# Patient Record
Sex: Female | Born: 1995 | Race: Black or African American | Hispanic: No | Marital: Single | Smoking: Never smoker
Health system: Southern US, Community
[De-identification: ages and names within clinical notes are randomized; demographics above are authoritative.]

## PROBLEM LIST (undated history)

## (undated) DIAGNOSIS — Z789 Other specified health status: Secondary | ICD-10-CM

## (undated) DIAGNOSIS — I10 Essential (primary) hypertension: Secondary | ICD-10-CM

## (undated) HISTORY — PX: NO PAST SURGERIES: SHX2092

## (undated) HISTORY — DX: Essential (primary) hypertension: I10

## (undated) HISTORY — DX: Other specified health status: Z78.9

---

## 2017-10-05 ENCOUNTER — Emergency Department (HOSPITAL_COMMUNITY)
Admission: EM | Admit: 2017-10-05 | Discharge: 2017-10-05 | Disposition: A | Discharge status: Home / Self Care | Payer: BC Managed Care – PPO | Attending: Emergency Medicine | Admitting: Emergency Medicine

## 2017-10-05 ENCOUNTER — Encounter (HOSPITAL_COMMUNITY): Payer: Self-pay | Admitting: Emergency Medicine

## 2017-10-05 ENCOUNTER — Emergency Department (HOSPITAL_COMMUNITY): Payer: BC Managed Care – PPO

## 2017-10-05 DIAGNOSIS — S99921A Unspecified injury of right foot, initial encounter: Secondary | ICD-10-CM | POA: Diagnosis present

## 2017-10-05 DIAGNOSIS — Y998 Other external cause status: Secondary | ICD-10-CM | POA: Diagnosis not present

## 2017-10-05 DIAGNOSIS — S93601A Unspecified sprain of right foot, initial encounter: Secondary | ICD-10-CM

## 2017-10-05 DIAGNOSIS — S9031XA Contusion of right foot, initial encounter: Secondary | ICD-10-CM

## 2017-10-05 DIAGNOSIS — Y92410 Unspecified street and highway as the place of occurrence of the external cause: Secondary | ICD-10-CM | POA: Insufficient documentation

## 2017-10-05 DIAGNOSIS — Y939 Activity, unspecified: Secondary | ICD-10-CM | POA: Diagnosis not present

## 2017-10-05 LAB — I-STAT BETA HCG BLOOD, ED (MC, WL, AP ONLY)

## 2017-10-05 MED ORDER — KETOROLAC TROMETHAMINE 60 MG/2ML IM SOLN
60.0000 mg | Freq: Once | INTRAMUSCULAR | Status: AC
  Administered 2017-10-05: 60 mg via INTRAMUSCULAR
  Filled 2017-10-05: qty 2

## 2017-10-05 MED ORDER — HYDROCODONE-ACETAMINOPHEN 5-325 MG PO TABS: 1.0000 | ORAL_TABLET | Freq: Four times a day (QID) | ORAL | 0 refills | Status: DC | PRN
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
## 2017-10-05 NOTE — Discharge Instructions (Signed)
You can take Tylenol or Ibuprofen as directed for pain. You can alternate Tylenol and Ibuprofen every 4 hours. If you take Tylenol at 1pm, then you can take Ibuprofen at 5pm. Then you can take Tylenol again at 9pm. You can take the pain medication for severe or breakthrough pain.  Do not take any more ibuprofen tonight as the medication we gave you contains ibuprofen.  Use the crutches for the next 4-5 days. After that, attempt per weight on the foot and start increasing as tolerated.  Follow the RICE (Rest, Ice, Compression, Elevation) protocol as directed.   Return the emergency Department immediately for any worsening pain, swelling of the foot, redness of the foot, discoloration of the foot, numbness or weakness of the foot or any other worsening or concerning symptoms.

## 2019-06-26 IMAGING — CR DG FOOT COMPLETE 3+V*R*
3 series · 3 of 3 positions shown · non-contrast
Comparison: None.

CLINICAL DATA: 20-year-old female with a history of injury to the
right foot pain at right great toe

EXAM:
RIGHT FOOT COMPLETE - 3+ VIEW

[x foot ap right]
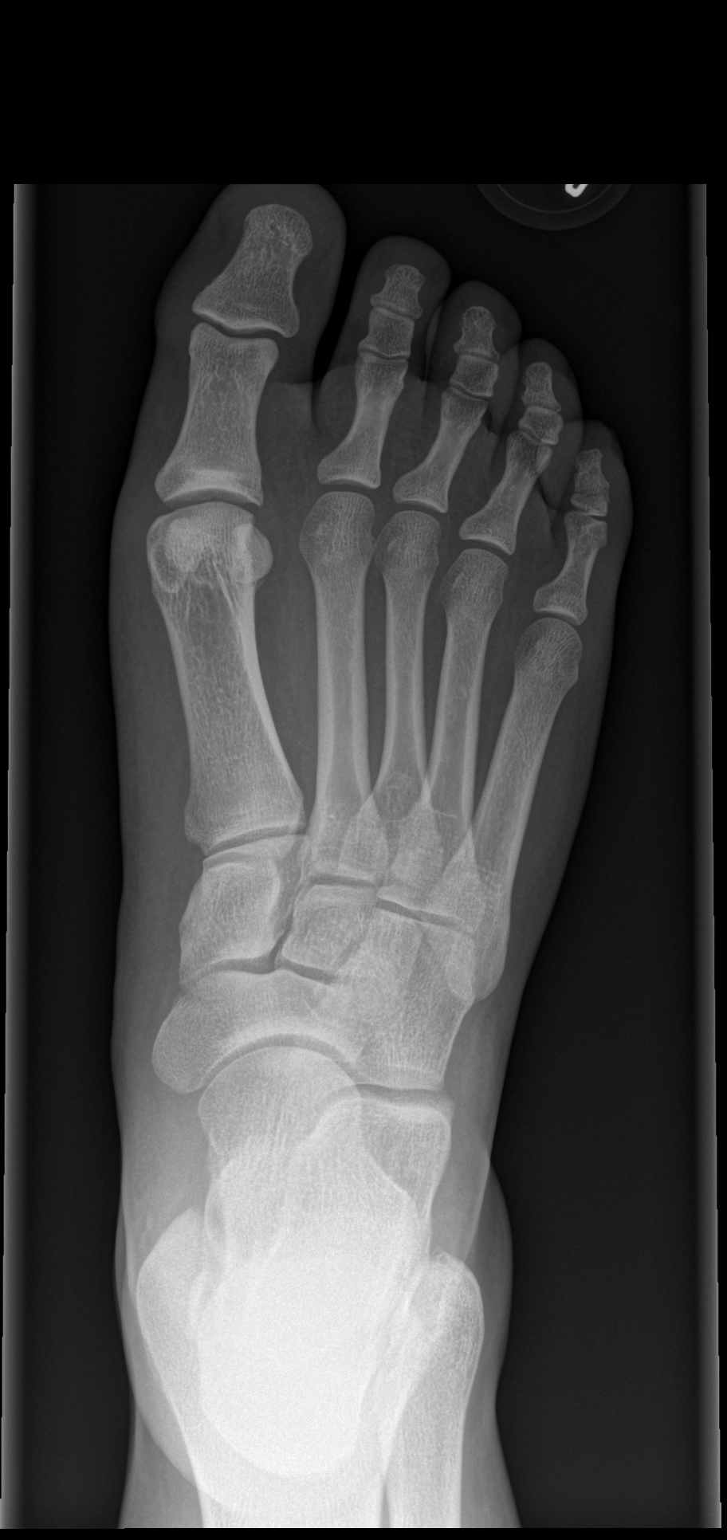

[x foot obl right]
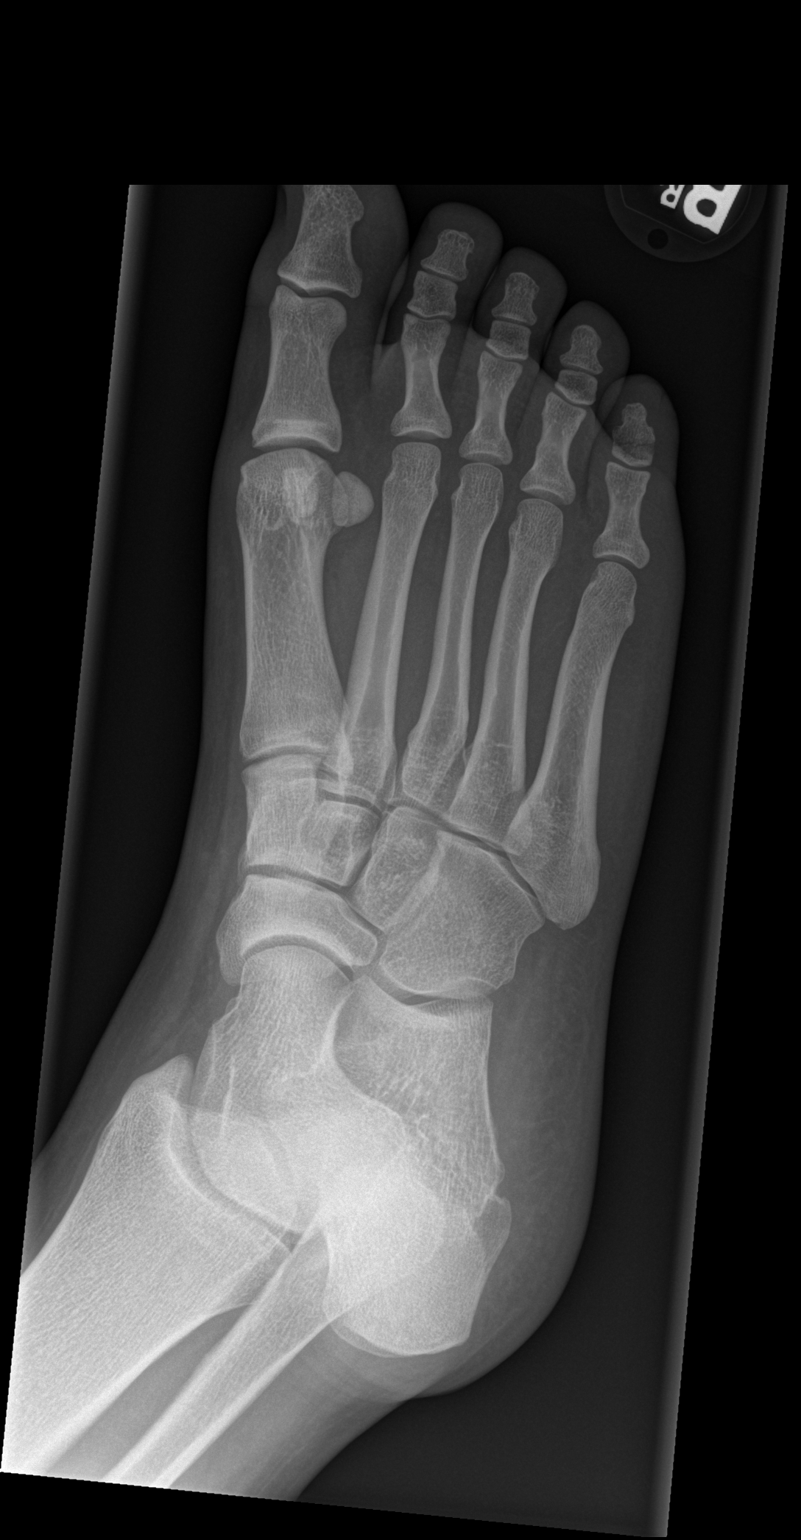

[x foot lat right]
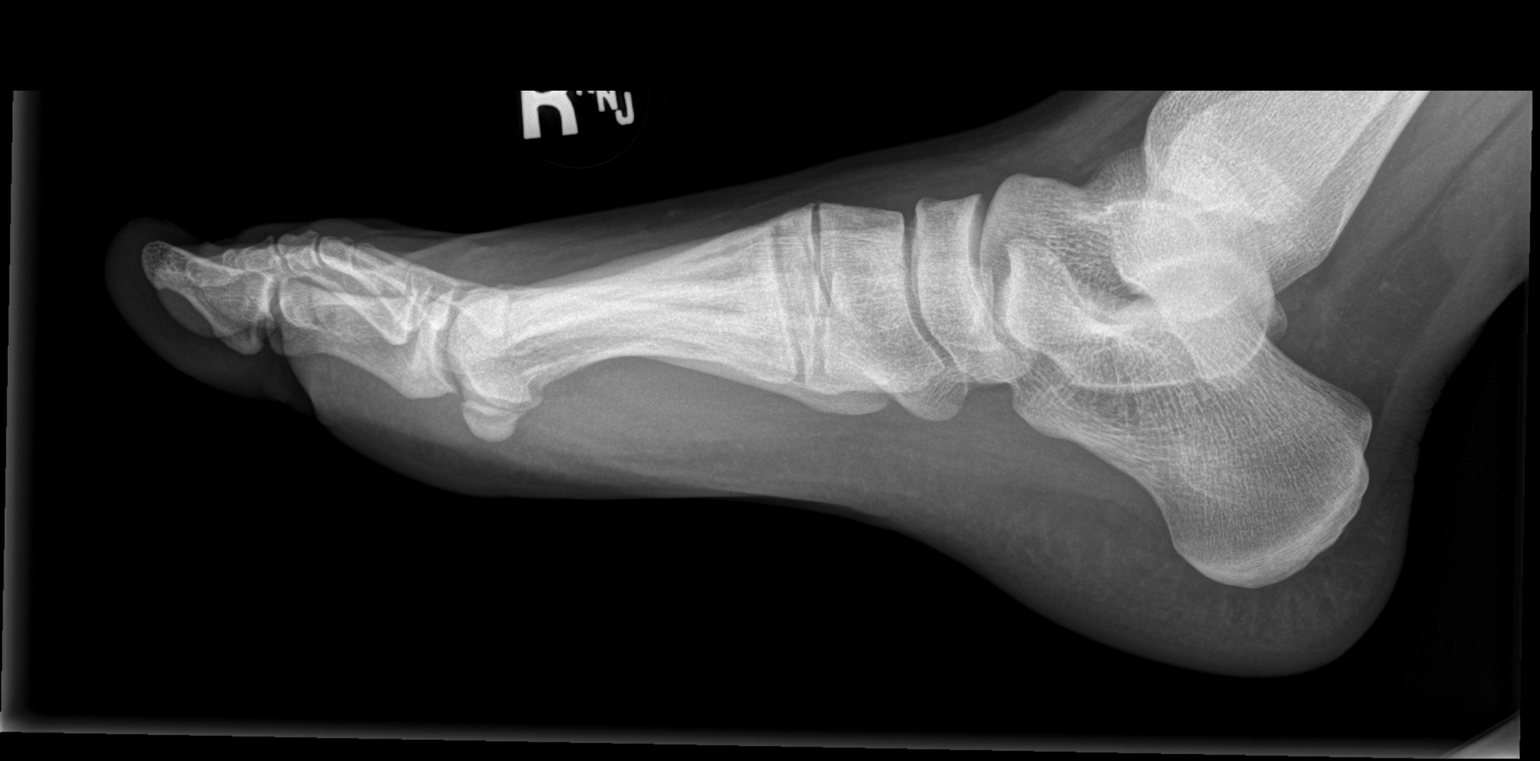

[3 of 3 positions shown; findings below may reference images not displayed]

FINDINGS: No acute displaced fracture identified. Lateral view demonstrates
questionable soft tissue swelling on the dorsum of the forefoot. No
radiopaque foreign body. Joints are congruent.
IMPRESSION: No acute fracture identified.

Questionable soft tissue swelling on the dorsum of the forefoot

## 2020-02-26 ENCOUNTER — Ambulatory Visit (INDEPENDENT_AMBULATORY_CARE_PROVIDER_SITE_OTHER): Payer: BC Managed Care – PPO | Admitting: Adult Health

## 2020-02-26 ENCOUNTER — Encounter: Payer: Self-pay | Admitting: Adult Health

## 2020-02-26 ENCOUNTER — Other Ambulatory Visit: Payer: Self-pay

## 2020-02-26 VITALS — BP 140/98 | HR 68 | Ht 63.0 in | Wt 200.0 lb

## 2020-02-26 DIAGNOSIS — F331 Major depressive disorder, recurrent, moderate: Secondary | ICD-10-CM | POA: Diagnosis not present

## 2020-02-26 DIAGNOSIS — F411 Generalized anxiety disorder: Secondary | ICD-10-CM

## 2020-02-26 DIAGNOSIS — G4700 Insomnia, unspecified: Secondary | ICD-10-CM | POA: Diagnosis not present

## 2020-02-26 DIAGNOSIS — F4310 Post-traumatic stress disorder, unspecified: Secondary | ICD-10-CM | POA: Diagnosis not present

## 2020-02-26 MED ORDER — HYDROXYZINE HCL 10 MG PO TABS: ORAL_TABLET | ORAL | 2 refills | Status: DC

## 2020-02-26 MED ORDER — FLUOXETINE HCL 10 MG PO CAPS: ORAL_CAPSULE | ORAL | 2 refills | Status: DC
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
## 2020-03-25 ENCOUNTER — Ambulatory Visit: Payer: BC Managed Care – PPO | Admitting: Adult Health

## 2021-04-06 ENCOUNTER — Institutional Professional Consult (permissible substitution): Payer: Self-pay | Admitting: Plastic Surgery

## 2021-07-09 ENCOUNTER — Institutional Professional Consult (permissible substitution): Payer: Self-pay | Admitting: Plastic Surgery

## 2023-05-31 ENCOUNTER — Encounter: Payer: Medicaid Other | Admitting: Obstetrics and Gynecology

## 2023-08-09 ENCOUNTER — Ambulatory Visit (INDEPENDENT_AMBULATORY_CARE_PROVIDER_SITE_OTHER): Payer: Medicaid Other

## 2023-08-09 DIAGNOSIS — Z3201 Encounter for pregnancy test, result positive: Secondary | ICD-10-CM | POA: Diagnosis not present

## 2023-08-09 DIAGNOSIS — N912 Amenorrhea, unspecified: Secondary | ICD-10-CM | POA: Diagnosis not present

## 2023-08-09 DIAGNOSIS — Z3200 Encounter for pregnancy test, result unknown: Secondary | ICD-10-CM

## 2023-08-09 LAB — POCT URINE PREGNANCY: Preg Test, Ur: POSITIVE — AB

## 2023-08-09 MED ORDER — PRENATE MINI 18-0.6-0.4-350 MG PO CAPS: 1.0000 | ORAL_CAPSULE | Freq: Every day | ORAL | 11 refills | Status: DC

## 2023-08-09 NOTE — Progress Notes (Signed)
..  Ms. Callins presents today for UPT. She has no unusual complaints. LMP:06/25/23    OBJECTIVE: Appears well, in no apparent distress.  OB History     Gravida  1   Para      Term      Preterm      AB      Living         SAB      IAB      Ectopic      Multiple      Live Births             Home UPT Result:Positive In-Office UPT result:Positive  I have reviewed the patient's medical, obstetrical, social, and family histories, and medications.   ASSESSMENT: Positive pregnancy test  PLAN Prenatal care to be completed at: [facility] Provided safe med list PNV sent to pharmacy

## 2023-08-31 ENCOUNTER — Ambulatory Visit: Payer: Medicaid Other | Admitting: *Deleted

## 2023-08-31 ENCOUNTER — Other Ambulatory Visit (INDEPENDENT_AMBULATORY_CARE_PROVIDER_SITE_OTHER): Payer: Medicaid Other

## 2023-08-31 VITALS — BP 139/82 | HR 77 | Wt 215.7 lb

## 2023-08-31 DIAGNOSIS — O3680X0 Pregnancy with inconclusive fetal viability, not applicable or unspecified: Secondary | ICD-10-CM

## 2023-08-31 DIAGNOSIS — Z3480 Encounter for supervision of other normal pregnancy, unspecified trimester: Secondary | ICD-10-CM | POA: Insufficient documentation

## 2023-08-31 DIAGNOSIS — Z3401 Encounter for supervision of normal first pregnancy, first trimester: Secondary | ICD-10-CM

## 2023-08-31 DIAGNOSIS — Z3A09 9 weeks gestation of pregnancy: Secondary | ICD-10-CM

## 2023-08-31 MED ORDER — PROMETHAZINE HCL 25 MG PO TABS: 25.0000 mg | ORAL_TABLET | Freq: Four times a day (QID) | ORAL | 1 refills | Status: DC | PRN

## 2023-08-31 MED ORDER — DOXYLAMINE-PYRIDOXINE 10-10 MG PO TBEC: 2.0000 | DELAYED_RELEASE_TABLET | Freq: Every day | ORAL | 5 refills | Status: DC

## 2023-08-31 MED ORDER — BLOOD PRESSURE KIT DEVI: 1.0000 | 0 refills | Status: AC

## 2023-08-31 NOTE — Patient Instructions (Signed)
[facility] has a partnership with the [employer] to provide prenatal navigation for the most needed resources in our community. In order to see how we can help connect you to these resources we need consent to contact you. Please complete the very short consent using the link below:   English Link: https://[place].tfaforms.net/283?site=16  Spanish Link: https://[place].tfaforms.net/287?site=16  Options for Doula Care in the [place] Area  As you review your birthing options, consider having a birth doula. A doula is trained to provide support before, during and just after you give birth. There are also postpartum doulas that help you adjust to new parenthood.  While doulas do not provide medical care, they do provide emotional, physical and educational support. A few months before your baby arrives, doulas can help answer questions, ease concerns and help you create and support your birthing plan.    Doulas can help reduce your stress and comfort you and your partner. They can help you cope with labor by helping you use breathing techniques, massage, creative labor positioning, essential oils and affirmations.   Studies show that the benefits of having a doula include:   A more positive birth experience  Fewer requests for pain-relief medication  Less likelihood of cesarean section, commonly called a c-section   Doulas are typically hired via a Ad[employer] account planner between you and the doula. We are happy to provide a list of the most active doulas in the area, all of whom are credentialed by [facility] and will not count as a visitor at your birth.  There are several options for no-cost doula care at our hospital, including:  [facility] Volunteer Doula Program Every [employer] Program A Cure 4 Moms Doula Study (available only at [employer] [facility], [place], [place] and [facility] offices)  For more information on these programs or to receive  a list of doulas active in our area, please email [email or web address] .[email or web address]  Our practice his participating in a study that provides no-cost doula care. ACURE4Moms is a study looking at how doula care can reduce birthing disparities for Black and brown birthing people. We like to refer patients as soon as possible, but definitely before 28 weeks so patients can get to know their doula.    A doula is trained to provide support before, during and just after you give birth. While doulas do not provide medical care, they do provide emotional, physical and educational support. Doulas can help reduce your stress and comfort you and your partner. They can help you cope with labor by helping you use breathing techniques, massage, creative labor positioning, essential oils and affirmations.   ACURE4Moms is a research study trying to reduce:   low birthweight babies  emergency department visits & hospitalizations for birthing persons and their babies  depression among birthing people  discrimination in pregnancy-related care ACURE4Moms is trying out 2 programs designed by  people who have given birth. These programs include: 1. Sharing patient data and warning alerts with clinic staff to keep them accountable for their patients' outcomes and providing tools to help them  reduce bias in care. 2. Matching eligible patients with doulas from the  same community as the patients.  If you would like to participate in this study, please visit:   http://carroll-castaneda.info/

## 2023-08-31 NOTE — Progress Notes (Signed)
New OB Intake  I connected with Rachel Conley  on 08/31/23 at  2:10 PM EDT by In Person Visit and verified that I am speaking with the correct person using two identifiers. Nurse is located at [facility] and pt is located at [facility].  I discussed the limitations, risks, security and privacy concerns of performing an evaluation and management service by telephone and the availability of in person appointments. I also discussed with the patient that there may be a patient responsible charge related to this service. The patient expressed understanding and agreed to proceed.  I explained I am completing New OB Intake today. We discussed EDD of 03/31/2024, by Last Menstrual Period. Pt is G1P0. I reviewed her allergies, medications and Medical/Surgical/OB history.    There are no problems to display for this patient.   Concerns addressed today  Delivery Plans Plans to deliver at [facility]. Discussed the nature of our practice with multiple providers including residents and students. Due to the size of the practice, the delivering provider may not be the same as those providing prenatal care.   Patient is interested in water birth. Offered upcoming OB visit with CNM to discuss further.  MyChart/Babyscripts MyChart access verified. I explained pt will have some visits in office and some virtually. Babyscripts instructions given and order placed. Patient verifies receipt of registration text/e-mail. Account successfully created and app downloaded.  Blood Pressure Cuff/Weight Scale Blood pressure cuff ordered for patient to pick-up from [employer]. Explained after first prenatal appt pt will check weekly and document in Babyscripts. Patient does not have weight scale; patient may purchase if they desire to track weight weekly in Babyscripts.  Anatomy US Explained first scheduled [place] will be around 19 weeks. Anatomy US scheduled for TBD at TBD.  Interested in [place]? If yes, send referral and doula  dot phrase.   Is patient a candidate for Babyscripts Optimization? Yes  First visit review I reviewed new OB appt with patient. Explained pt will be seen by Albertine Grates, NP at first visit. Discussed Avelina Laine genetic screening with patient. Requests Panorama and Horizon.. Routine prenatal labs  collected at today's visit.    Last Pap No results found for: "DIAGPAP"  Harrel Lemon, RN 08/31/2023  2:14 PM

## 2023-09-01 LAB — CBC/D/PLT+RPR+RH+ABO+RUBIGG...
Antibody Screen: NEGATIVE
Basophils Absolute: 0 10*3/uL (ref 0.0–0.2)
Basos: 0 %
EOS (ABSOLUTE): 0.3 10*3/uL (ref 0.0–0.4)
Eos: 3 %
HCV Ab: NONREACTIVE
HIV Screen 4th Generation wRfx: NONREACTIVE
Hematocrit: 38.5 % (ref 34.0–46.6)
Hemoglobin: 12.6 g/dL (ref 11.1–15.9)
Hepatitis B Surface Ag: NEGATIVE
Immature Grans (Abs): 0 10*3/uL (ref 0.0–0.1)
Immature Granulocytes: 0 %
Lymphocytes Absolute: 2.1 10*3/uL (ref 0.7–3.1)
Lymphs: 22 %
MCH: 31.1 pg (ref 26.6–33.0)
MCHC: 32.7 g/dL (ref 31.5–35.7)
MCV: 95 fL (ref 79–97)
Monocytes Absolute: 0.6 10*3/uL (ref 0.1–0.9)
Monocytes: 7 %
Neutrophils Absolute: 6.3 10*3/uL (ref 1.4–7.0)
Neutrophils: 68 %
Platelets: 192 10*3/uL (ref 150–450)
RBC: 4.05 x10E6/uL (ref 3.77–5.28)
RDW: 12.5 % (ref 11.7–15.4)
RPR Ser Ql: NONREACTIVE
Rh Factor: POSITIVE
Rubella Antibodies, IGG: 1.45 {index} (ref >0.99)
WBC: 9.3 10*3/uL (ref 3.4–10.8)

## 2023-09-01 LAB — HCV INTERPRETATION

## 2023-09-01 LAB — HEMOGLOBIN A1C
Est. average glucose Bld gHb Est-mCnc: 103 mg/dL
Hgb A1c MFr Bld: 5.2 % (ref 4.8–5.6)

## 2023-09-02 LAB — CULTURE, OB URINE

## 2023-09-02 LAB — URINE CULTURE, OB REFLEX

## 2023-09-14 ENCOUNTER — Other Ambulatory Visit (HOSPITAL_COMMUNITY)
Admission: RE | Admit: 2023-09-14 | Discharge: 2023-09-14 | Disposition: A | Discharge status: Home / Self Care | Payer: Medicaid Other | Source: Ambulatory Visit | Attending: Obstetrics and Gynecology | Admitting: Obstetrics and Gynecology

## 2023-09-14 ENCOUNTER — Ambulatory Visit: Payer: Medicaid Other | Admitting: Obstetrics and Gynecology

## 2023-09-14 VITALS — BP 135/87 | HR 85 | Wt 219.3 lb

## 2023-09-14 DIAGNOSIS — Z3401 Encounter for supervision of normal first pregnancy, first trimester: Secondary | ICD-10-CM

## 2023-09-14 DIAGNOSIS — Z124 Encounter for screening for malignant neoplasm of cervix: Secondary | ICD-10-CM | POA: Insufficient documentation

## 2023-09-14 DIAGNOSIS — Z3A11 11 weeks gestation of pregnancy: Secondary | ICD-10-CM | POA: Diagnosis not present

## 2023-09-14 DIAGNOSIS — Z3480 Encounter for supervision of other normal pregnancy, unspecified trimester: Secondary | ICD-10-CM

## 2023-09-14 MED ORDER — ASPIRIN 81 MG PO TBEC: 81.0000 mg | DELAYED_RELEASE_TABLET | Freq: Every day | ORAL | 2 refills | Status: DC

## 2023-09-14 NOTE — Patient Instructions (Signed)
Considering Waterbirth? Guide for patients at [facility] for [employer] [facility]) Why consider waterbirth? Gentle birth for babies  Less pain medicine used in labor  May allow for passive descent/less pushing  May reduce perineal tears  More mobility and instinctive maternal position changes  Increased maternal relaxation   Is waterbirth safe? What are the risks of infection, drowning or other complications? Infection:  Very low risk (3.7 % for tub vs 4.8% for bed)  7 in 8000 waterbirths with documented infection  Poorly cleaned equipment most common cause  Slightly lower group B strep transmission rate  Drowning  Maternal:  Very low risk  Related to seizures or fainting  Newborn:  Very low risk. No evidence of increased risk of respiratory problems in multiple large studies  Physiological protection from breathing under water  Avoid underwater birth if there are any fetal complications  Once baby's head is out of the water, keep it out.  Birth complication  Some reports of cord trauma, but risk decreased by bringing baby to surface gradually  No evidence of increased risk of shoulder dystocia. Mothers can usually change positions faster in water than in a bed, possibly aiding the maneuvers to free the shoulder.   There are 2 things you MUST do to have a waterbirth with [facility]: Attend a waterbirth class at [place] at [facility]   3rd Wednesday of every month from 7-9 pm (virtual during [place]) [employer] at [email or web address] or [email or web address] or by calling [phone] Bring [place] the certificate from the class to your prenatal appointment or send via [place] with a midwife at 36 weeks* to see if you can still plan a waterbirth and to sign the consent.   *We also recommend that you schedule as many of your prenatal visits with a midwife as possible.    Helpful information: You may want to bring a bathing suit top to the hospital  to wear during labor but this is optional.  All other supplies are provided by the hospital. Please arrive at the hospital with signs of active labor, and do not wait at home until late in labor. It takes 45 min- 1 hour for fetal monitoring, and check in to your room to take place, plus transport and filling of the waterbirth tub.    Things that would prevent you from having a waterbirth: Premature, <37wks  Previous cesarean birth  Presence of thick meconium-stained fluid  Multiple gestation (Twins, triplets, etc.)  Uncontrolled diabetes or gestational diabetes requiring medication  Hypertension diagnosed in pregnancy or preexisting hypertension (gestational hypertension, preeclampsia, or chronic hypertension) Fetal growth restriction (your baby measures less than 10th percentile on ultrasound) Heavy vaginal bleeding  Non-reassuring fetal heart rate  Active infection (MRSA, etc.). Group B Strep is NOT a contraindication for waterbirth.  If your labor has to be induced and induction method requires continuous monitoring of the baby's heart rate  Other risks/issues identified by your obstetrical provider   Please remember that birth is unpredictable. Under certain unforeseeable circumstances your provider may advise against giving birth in the tub. These decisions will be made on a case-by-case basis and with the safety of you and your baby as our highest priority.    Updated 03/09/22  We highly recommend childbirth education to help you plan for labor and begin practicing coping skills (which will be needed with or without pain meds).  [place] Childbirth Education Options: Sign up by visiting [email or web address]  Childbirth ~ Self-Paced eClass (English  and Spanish) This online class offers you the freedom to complete a childbirth education series in the comfort of your own home at your own pace.  Childbirth Class (In-Person 4-Week Series  or on Saturdays, Virtual 4-Week Series ~  [place]) This interactive in-person class series will help you and your partner prepare for your birth experience. Topics include: Labor & Birth, Comfort Measures, Breathing Techniques, Massage, Medical Interventions, Pain Management Options, Cesarean Birth, Postpartum Care, and Newborn Care  Comfort Techniques for Labor ~ In-Person Class [facility]) This interactive class is designed for parents-to-be who want to learn & practice hands-on skills to help relieve some of the discomfort of labor and encourage their babies to rotate toward the best position for birth. Moms and their partners will be able to try a variety of labor positions with birth balls and rebozos as well as practice breathing, relaxation, and visualization techniques.  Natural Childbirth Class (In-Person 5-Week Series, In-Person on Saturdays or Virtual 5-Week Series ~ [place]) This class series is designed for expectant parents who want to learn and practice natural methods of coping with the process of labor and childbirth.  Cesarean Birth Self-Paced eClass (English and Spanish) This online course provides comprehensive information you can trust as you prepare for a possible cesarean birth. In this class, you'll learn how to make your birth and recovery comfortable and joyful through instructive video clips, animations, and activities.  Waterbirth ~ Architect Interested in a waterbirth? In addition to a consultation with your credentialed waterbirth provider, this free, informational online class will help you discover whether waterbirth is the right fit for you. Not all obstetrical practices offer waterbirth, so check with your healthcare provider.  Tour Scientist, research (physical sciences)) - Women's and [place] our 4 minute video tour of [place] located in [place].   [place] Parenting Education Options:  Pregnancy 101 (Virtual) Congratulations on your pregnancy!  This class is geared toward moms in their first trimester, but everyone is welcome. We are excited to guide you through all aspects of supporting a healthy pregnancy. You will learn what to expect at routine prenatal care appointments, common postpartum adjustments, basic infant safety, and breastfeeding.  Successful Partnering & Parenting ~ [place] Workshop [facility]) This workshop inspires and equips partners of all economic levels, ages, and cultures to confidently care for their infants, support the birthing persons, and navigate their own transformations into new partners and parents. Learning activities are geared towards supporting partner, but moms are welcome to attend.  'Baby & Me' Parenting Group (Virtual on Wednesdays at 11am) Enjoy this time discussing newborn & infant parenting topics and family adjustment issues with other new parents in a relaxed environment. Each week brings a new speaker or baby-centered activity. This group offers support and connection to parents as they journey through the adjustments and struggles of that sometimes overwhelming first year after the birth of a child.  Baby Safety, CPR, & Choking Class ~ Virtual This life-saving information is meant to encourage parents as they learn important safety and prevention tips as well as infant CPR and relief of choking.  Breastfeeding Class (In-Person in [place] or [employer]) Families learn what to expect in the first days and weeks of breastfeeding your newborn. IF YOU ARE AN EMPLOYEE TAKING THIS CLASS FOR CREDIT, DO NOT register yourself. Please e-mail [email or web address].   Breastfeeding Self-Paced eClass (English & Spanish) Families learn what to expect in the first days and weeks of breastfeeding your newborn.  Caring for Baby ~ In-Person, Virtual or Self-Paced Class This in-person class is for both expectant and adoptive parents who want to learn and practice the most up-to-date newborn care for their  babies. Focus is on birth through the first six weeks of life.  CPR & Choking Relief for Infants & Children ~ In-Person Class [facility]) This in-person course is designed for any parent, expectant parent, or adult who cares for infants or children. Participants learn and demonstrate cardiopulmonary resuscitation and choking relief procedures for both infants and children.  Grandparent Love ~ In-Person Class Grandparents will learn the most updated infant care and safety recommendations. They will discover ways to support their own children during the transition into the parenting role and receive tips on communicating with the new parents.  [place] Parenting Support Group Options:  Bereavement Grief Support Group (Pregnancy/Infant Loss) - Virtual This is an ongoing experience that meets once a month and is designed to help you honor the past, assist you in discovering tools to strengthen you today, and aid you in developing hope for the future.  Breastfeeding & Pumping Support Group (In-Person on Thursdays at 12pm or Virtual on Tuesdays at [place]) Join [place] in-person each Thursday starting June 1st, 2023 at 12pm! This support group is free for all families looking for breastfeeding and/or pumping support.   Community-Based Childbirth Education Options:  [facility] Department Classes:  Childbirth education classes can help you get ready for a positive parenting experience. You can also meet other expectant parents and get free stuff for your baby. Each class runs for five weeks on the same night and costs $45 for the mother-to-be and her support person. Medicaid covers the cost if you are eligible. Call [phone] to register.  [facility] offers a variety of programs for the [employer] and is another great way to get connected. Please go to [email or web address] for more information.  Childbirth With A Twist! Be informed of your options, get  educated on birth, understand what your body is doing, learn how to cope, and have a lot of fun and laughs all while doing it either from the comfort of your couch OR in our cozy office and classroom space near the [place] airport. If you are taking a virtual class, then class is taught LIVE, so you can ask questions and receive answers in real-time from an experienced doula and childbirth educator.  This virtual childbirth education class will meet for five instruction times online.  Although we are based in [place], [place], this virtual class is open to anyone in the world. Please visit: [email or web address] for more information.  Books We Love: The Doula Guide to Childbirth by Corinna Lines and Reola Calkins The First-Time Parent's Childbirth Handbook by Dr. Edmonia James, CNM The Birth Partner by Antionette Fairy

## 2023-09-14 NOTE — Progress Notes (Signed)
INITIAL PRENATAL VISIT  Subjective:   Rachel Conley is being seen today for her first obstetrical visit.  This is a planned pregnancy. This is a desired pregnancy.  She is at [redacted]w[redacted]d gestation by LMP. Her obstetrical history is significant for  none . Relationship with FOB: significant other, living together. Patient does intend to breast feed. Pregnancy history fully reviewed.  Patient reports  nausea .  Indications for ASA therapy (per uptodate) One of the following: Previous pregnancy with preeclampsia, especially early onset and with an adverse outcome No Multifetal gestation No Chronic hypertension No Type 1 or 2 diabetes mellitus No Chronic kidney disease No Autoimmune disease (antiphospholipid syndrome, systemic lupus erythematosus) No  Two or more of the following: Nulliparity Yes Obesity (body mass index >30 kg/m2)  Family history of preeclampsia in mother or sister No Age >=35 years No Sociodemographic characteristics (African American race, low socioeconomic level) Yes Personal risk factors (eg, previous pregnancy with low birth weight or small for gestational age infant, previous adverse pregnancy outcome [eg, stillbirth], interval >10 years between pregnancies) No   Objective:    Obstetric History OB History  Gravida Para Term Preterm AB Living  1 0 0 0 0 0  SAB IAB Ectopic Multiple Live Births  0 0 0 0 0    # Outcome Date GA Lbr Len/2nd Weight Sex Type Anes PTL Lv  1 Current             Past Medical History:  Diagnosis Date   Medical history non-contributory     Past Surgical History:  Procedure Laterality Date   NO PAST SURGERIES      Current Outpatient Medications on File Prior to Visit  Medication Sig Dispense Refill   Prenatal Vit-Fe Fumarate-FA (PRENATAL VITAMINS PO) Take 1 tablet by mouth daily.     promethazine (PHENERGAN) 25 MG tablet Take 1 tablet (25 mg total) by mouth every 6 (six) hours as needed for nausea or vomiting. 30 tablet 1    Blood Pressure Monitoring (BLOOD PRESSURE KIT) DEVI 1 Device by Does not apply route once a week. (Patient not taking: Reported on 09/14/2023) 1 each 0   Doxylamine-Pyridoxine (DICLEGIS) 10-10 MG TBEC Take 2 tablets by mouth at bedtime. If symptoms persist, add one tablet in the morning and one in the afternoon (Patient not taking: Reported on 09/14/2023) 100 tablet 5   FLUoxetine (PROZAC) 10 MG capsule Take one capsule daily x 7 days, then take two capsules daily. (Patient not taking: Reported on 08/09/2023) 60 capsule 2   fluticasone (FLONASE) 50 MCG/ACT nasal spray Place 1 spray into both nostrils daily. (Patient not taking: Reported on 08/31/2023)     hydrOXYzine (ATARAX/VISTARIL) 10 MG tablet Take one to two tablets at bedtime as needed for sleep. (Patient not taking: Reported on 08/09/2023) 60 tablet 2   Prenat-FeCbn-FeAsp-Meth-FA-DHA (PRENATE MINI) 18-0.6-0.4-350 MG CAPS Take 1 tablet by mouth daily. (Patient not taking: Reported on 08/31/2023) 30 capsule 11   No current facility-administered medications on file prior to visit.    No Known Allergies  Social History:  reports that she has never smoked. She has never used smokeless tobacco. She reports that she does not currently use drugs after having used the following drugs: Marijuana. She reports that she does not drink alcohol.  Family History  Problem Relation Age of Onset   Varicose Veins Mother     The following portions of the patient's history were reviewed and updated as appropriate: allergies, current medications, past  family history, past medical history, past social history, past surgical history and problem list.  Review of Systems Review of Systems  All other systems reviewed and are negative.   Physical Exam:  BP 135/87   Pulse 85   Wt 219 lb 4.8 oz (99.5 kg)   LMP 06/25/2023   BMI 38.85 kg/m  CONSTITUTIONAL: Well-developed, well-nourished female in no acute distress.  HENT:  Normocephalic EYES: Conjunctivae normal.   NECK: Normal range of motion SKIN: Skin is warm and dry MUSCULOSKELETAL: Normal range of motion.   NEUROLOGIC: Alert and oriented PSYCHIATRIC: Normal mood and affect. Normal behavior. Normal judgment and thought content. CARDIOVASCULAR: Normal heart rate noted RESPIRATORY: Normal effort ABDOMEN: Soft PELVIC: Normal appearing external genitalia; normal appearing vaginal mucosa and cervix.  No abnormal discharge noted.  Pap smear obtained.  Normal uterine size, no other palpable masses, no uterine or adnexal tenderness.  Fetal Heart Rate (bpm): 169   Movement: Absent       Assessment:    Pregnancy: G1P0000  1. Supervision of other normal pregnancy, antepartum 2. [redacted] weeks gestation of pregnancy BP and FHR normal Discussed recommendation for ASA during pregnancy, rx sent to start at 12 weeks Initial labs drawn and reviewed Prenatal vitamins. Problem list reviewed and updated. Reviewed in detail the nature of the practice with collaborative care between  Genetic screening discussed: NIPS/First trimester screen/Quad/AFP ordered. Role of ultrasound in pregnancy discussed; Anatomy [place]: ordered.  Interested in water birth, discussed must stay low risk during pregnancy, take water birth class, and meet with waterbirth provider. Information given in wrap up   Follow up in 4 weeks. Discussed clinic routines, schedule of care and testing, genetic screening options, involvement of students and residents under the direct supervision of APPs and doctors and presence of female providers. Pt verbalized understanding.  Return in 4 weeks for routine prenatal care Future Appointments  Date Time Provider Department Center  10/11/2023  2:50 PM Gerrit Heck, CNM [facility] None  11/07/2023 12:15 PM [facility] NURSE [facility]  11/07/2023 12:30 PM [facility] US2 [facility]   Sue Lush, FNP

## 2023-09-15 LAB — CERVICOVAGINAL ANCILLARY ONLY
Bacterial Vaginitis (gardnerella): POSITIVE — AB
Candida Glabrata: NEGATIVE
Candida Vaginitis: NEGATIVE
Chlamydia: NEGATIVE
Comment: NEGATIVE
Comment: NEGATIVE
Comment: NEGATIVE
Comment: NEGATIVE
Comment: NEGATIVE
Comment: NORMAL
Neisseria Gonorrhea: NEGATIVE
Trichomonas: NEGATIVE

## 2023-09-15 LAB — CYTOLOGY - PAP: Diagnosis: NEGATIVE

## 2023-09-19 ENCOUNTER — Other Ambulatory Visit: Payer: Self-pay

## 2023-09-19 DIAGNOSIS — N760 Acute vaginitis: Secondary | ICD-10-CM

## 2023-09-19 MED ORDER — METRONIDAZOLE 500 MG PO TABS: 500.0000 mg | ORAL_TABLET | Freq: Two times a day (BID) | ORAL | 0 refills | Status: DC

## 2023-09-22 LAB — PANORAMA PRENATAL TEST FULL PANEL:PANORAMA TEST PLUS 5 ADDITIONAL MICRODELETIONS: FETAL FRACTION: 4.8

## 2023-09-25 ENCOUNTER — Inpatient Hospital Stay (HOSPITAL_COMMUNITY)
Admission: AD | Admit: 2023-09-25 | Discharge: 2023-09-25 | Disposition: A | Discharge status: Home / Self Care | Payer: Medicaid Other | Attending: Obstetrics and Gynecology | Admitting: Obstetrics and Gynecology

## 2023-09-25 DIAGNOSIS — Z3480 Encounter for supervision of other normal pregnancy, unspecified trimester: Secondary | ICD-10-CM

## 2023-09-25 DIAGNOSIS — O219 Vomiting of pregnancy, unspecified: Secondary | ICD-10-CM | POA: Insufficient documentation

## 2023-09-25 DIAGNOSIS — Z3A13 13 weeks gestation of pregnancy: Secondary | ICD-10-CM | POA: Diagnosis not present

## 2023-09-25 LAB — HORIZON CUSTOM: REPORT SUMMARY: NEGATIVE

## 2023-09-25 MED ORDER — PROMETHAZINE HCL 25 MG PO TABS: 25.0000 mg | ORAL_TABLET | Freq: Four times a day (QID) | ORAL | 5 refills | Status: DC | PRN

## 2023-09-25 MED ORDER — DOXYLAMINE-PYRIDOXINE 10-10 MG PO TBEC: 2.0000 | DELAYED_RELEASE_TABLET | Freq: Every day | ORAL | 5 refills | Status: DC

## 2023-09-25 MED ORDER — ONDANSETRON 4 MG PO TBDP
8.0000 mg | ORAL_TABLET | Freq: Once | ORAL | Status: AC
  Administered 2023-09-25: 8 mg via ORAL
  Filled 2023-09-25: qty 2

## 2023-09-25 MED ORDER — ONDANSETRON 4 MG PO TBDP: 4.0000 mg | ORAL_TABLET | Freq: Four times a day (QID) | ORAL | 5 refills | Status: DC | PRN

## 2023-09-25 MED ORDER — PROMETHAZINE HCL 25 MG PO TABS
25.0000 mg | ORAL_TABLET | Freq: Once | ORAL | Status: AC
  Administered 2023-09-25: 25 mg via ORAL
  Filled 2023-09-25: qty 1

## 2023-09-25 NOTE — MAU Provider Note (Signed)
History     [id number]  Arrival date and time: 09/25/23 1318    Chief Complaint  Patient presents with   Emesis   Nausea     HPI Rachel Conley is a 27 y.o. at [redacted]w[redacted]d, who presents for nausea and inability to take PO.   Patient reports weeks of poor PO and ongoing n/v Initially took zofran for a bit but felt it wasn't helping so stopped about a month ago Had rx sent for unisom a few weeks ago but has not taken it Has not tried anything else No pain, no vaginal bleeding   A/Positive/-- (09/26 1523)  OB History     Gravida  1   Para  0   Term  0   Preterm  0   AB  0   Living  0      SAB  0   IAB  0   Ectopic  0   Multiple  0   Live Births  0           Past Medical History:  Diagnosis Date   Medical history non-contributory     Past Surgical History:  Procedure Laterality Date   NO PAST SURGERIES      Family History  Problem Relation Age of Onset   Varicose Veins Mother     Social History   Socioeconomic History   Marital status: Single    Spouse name: Not on file   Number of children: Not on file   Years of education: Not on file   Highest education level: Not on file  Occupational History   Not on file  Tobacco Use   Smoking status: Never   Smokeless tobacco: Never  Vaping Use   Vaping status: Never Used  Substance and Sexual Activity   Alcohol use: No   Drug use: Not Currently    Types: Marijuana    Comment: monthly   Sexual activity: Yes    Birth control/protection: None  Other Topics Concern   Not on file  Social History Narrative   Not on file   Social Determinants of Health   Financial Resource Strain: Not on file  Food Insecurity: Not on file  Transportation Needs: Not on file  Physical Activity: Not on file  Stress: Not on file  Social Connections: Not on file  Intimate Partner Violence: Not on file    No Known Allergies  No current facility-administered medications on file prior to encounter.    Current Outpatient Medications on File Prior to Encounter  Medication Sig Dispense Refill   aspirin EC 81 MG tablet Take 1 tablet (81 mg total) by mouth daily. Start taking when you are [redacted] weeks pregnant for rest of pregnancy for prevention of preeclampsia 300 tablet 2   Blood Pressure Monitoring (BLOOD PRESSURE KIT) DEVI 1 Device by Does not apply route once a week. (Patient not taking: Reported on 09/14/2023) 1 each 0   metroNIDAZOLE (FLAGYL) 500 MG tablet Take 1 tablet (500 mg total) by mouth 2 (two) times daily. 14 tablet 0   Prenatal Vit-Fe Fumarate-FA (PRENATAL VITAMINS PO) Take 1 tablet by mouth daily.       ROS Pertinent positives and negative per HPI, all others reviewed and negative  Physical Exam   BP (!) 149/82 (BP Location: Right Arm)   Pulse 86   Temp 98.7 F (37.1 C) (Oral)   Resp 18   Ht 5\' 3"  (1.6 m)   Wt 98.1 kg   LMP  06/25/2023   SpO2 100%   BMI 38.30 kg/m   Patient Vitals for the past 24 hrs:  BP Temp Temp src Pulse Resp SpO2 Height Weight  09/25/23 1429 (!) 149/82 98.7 F (37.1 C) Oral 86 18 100 % 5\' 3"  (1.6 m) 98.1 kg    Physical Exam Vitals reviewed.  Constitutional:      General: She is not in acute distress.    Appearance: She is well-developed. She is not diaphoretic.  Eyes:     General: No scleral icterus. Pulmonary:     Effort: Pulmonary effort is normal. No respiratory distress.  Skin:    General: Skin is warm and dry.  Neurological:     Mental Status: She is alert.     Coordination: Coordination normal.      Cervical Exam    Bedside Ultrasound Pt informed that the ultrasound is considered a limited OB ultrasound and is not intended to be a complete ultrasound exam.  Patient also informed that the ultrasound is not being completed with the intent of assessing for fetal or placental anomalies or any pelvic abnormalities.  Explained that the purpose of today's ultrasound is to assess for  viability.  Patient acknowledges the purpose  of the exam and the limitations of the study.      My interpretation: RN unable to find heart tones with doppler. FHR 154 bpm by M mode. Normal fetal movement. Subjectively normal AFI.    Labs No results found for this or any previous visit (from the past 24 hour(s)).  Imaging No results found.  MAU Course  Procedures Lab Orders         Urinalysis, Routine w reflex microscopic -Urine, Clean Catch    Meds ordered this encounter  Medications   promethazine (PHENERGAN) tablet 25 mg   ondansetron (ZOFRAN-ODT) disintegrating tablet 8 mg   Doxylamine-Pyridoxine (DICLEGIS) 10-10 MG TBEC    Sig: Take 2 tablets by mouth at bedtime. If symptoms persist, add one tablet in the morning and one in the afternoon    Dispense:  100 tablet    Refill:  5   promethazine (PHENERGAN) 25 MG tablet    Sig: Take 1 tablet (25 mg total) by mouth every 6 (six) hours as needed for nausea or vomiting.    Dispense:  30 tablet    Refill:  5   ondansetron (ZOFRAN-ODT) 4 MG disintegrating tablet    Sig: Take 1 tablet (4 mg total) by mouth every 6 (six) hours as needed for nausea.    Dispense:  120 tablet    Refill:  5   Imaging Orders  No imaging studies ordered today    MDM Moderate (Level 3-4)  Assessment and Plan  #Nausea and vomiting of pregnancy #[redacted] weeks gestation of pregnancy Able to tolerate PO challenge after anti-emetics. Discussed taking meds regularly, small frequent meals, etc. Meds sent to pharmacy.   #FWB Normal FHR by bedside US   Dispo: discharged to home in stable condition    Venora Maples, MD/MPH 09/25/23 5:14 PM  Allergies as of 09/25/2023   No Known Allergies      Medication List     TAKE these medications    aspirin EC 81 MG tablet Take 1 tablet (81 mg total) by mouth daily. Start taking when you are [redacted] weeks pregnant for rest of pregnancy for prevention of preeclampsia   Blood Pressure Kit Devi 1 Device by Does not apply route once a week.    Doxylamine-Pyridoxine  10-10 MG Tbec Commonly known as: Diclegis Take 2 tablets by mouth at bedtime. If symptoms persist, add one tablet in the morning and one in the afternoon   metroNIDAZOLE 500 MG tablet Commonly known as: FLAGYL Take 1 tablet (500 mg total) by mouth 2 (two) times daily.   ondansetron 4 MG disintegrating tablet Commonly known as: ZOFRAN-ODT Take 1 tablet (4 mg total) by mouth every 6 (six) hours as needed for nausea.   PRENATAL VITAMINS PO Take 1 tablet by mouth daily.   promethazine 25 MG tablet Commonly known as: PHENERGAN Take 1 tablet (25 mg total) by mouth every 6 (six) hours as needed for nausea or vomiting.

## 2023-09-25 NOTE — MAU Note (Signed)
.  Rachel Conley is a 27 y.o. at [redacted]w[redacted]d here in MAU reporting: hasn't been able to keep anything down for the since [redacted] weeks gestation. She reports that she is able to eat, but it comes right back up. The nausea is constant. No episodes of emesis today.  LMP: N/A Onset of complaint: 3 weeks ago Pain score: denies pain Vitals:   09/25/23 1429  BP: (!) 149/82  Pulse: 86  Resp: 18  Temp: 98.7 F (37.1 C)  SpO2: 100%     ATF:TDDUKG to obtain dopplers Lab orders placed from triage:  UA

## 2023-09-25 NOTE — MAU Note (Signed)
RN went out to lobby to call patient for medications. Pt not in lobby or outside of MAU lobby.

## 2023-10-11 ENCOUNTER — Ambulatory Visit (INDEPENDENT_AMBULATORY_CARE_PROVIDER_SITE_OTHER): Payer: Medicaid Other

## 2023-10-11 VITALS — BP 146/86 | HR 99 | Wt 227.6 lb

## 2023-10-11 DIAGNOSIS — Z3A15 15 weeks gestation of pregnancy: Secondary | ICD-10-CM

## 2023-10-11 DIAGNOSIS — O219 Vomiting of pregnancy, unspecified: Secondary | ICD-10-CM

## 2023-10-11 DIAGNOSIS — Z3480 Encounter for supervision of other normal pregnancy, unspecified trimester: Secondary | ICD-10-CM

## 2023-10-11 DIAGNOSIS — O99210 Obesity complicating pregnancy, unspecified trimester: Secondary | ICD-10-CM

## 2023-10-11 NOTE — Patient Instructions (Signed)
We highly recommend childbirth education to help you plan for labor and begin practicing coping skills (which will be needed with or without pain meds).  [place] Childbirth Education Options: Sign up by visiting [email or web address]  Childbirth ~ Self-Paced eClass (English and Spanish) This online class offers you the freedom to complete a childbirth education series in the comfort of your own home at your own pace.  Childbirth Class (In-Person 4-Week Series  or on Saturdays, Virtual 4-Week Series ~ [place]) This interactive in-person class series will help you and your partner prepare for your birth experience. Topics include: Labor & Birth, Comfort Measures, Breathing Techniques, Massage, Medical Interventions, Pain Management Options, Cesarean Birth, Postpartum Care, and Newborn Care  Comfort Techniques for Labor ~ In-Person Class ([place]) This interactive class is designed for parents-to-be who want to learn & practice hands-on skills to help relieve some of the discomfort of labor and encourage their babies to rotate toward the best position for birth. Moms and their partners will be able to try a variety of labor positions with birth balls and rebozos as well as practice breathing, relaxation, and visualization techniques.  Natural Childbirth Class (In-Person 5-Week Series, In-Person on Saturdays or Virtual 5-Week Series ~ [place]) This class series is designed for expectant parents who want to learn and practice natural methods of coping with the process of labor and childbirth.  Cesarean Birth Self-Paced eClass (English and Spanish) This online course provides comprehensive information you can trust as you prepare for a possible cesarean birth. In this class, you'll learn how to make your birth and recovery comfortable and joyful through instructive video clips, animations, and activities.  Waterbirth ~ Virtual Class Interested in a waterbirth? In addition to a consultation  with your credentialed waterbirth provider, this free, informational online class will help you discover whether waterbirth is the right fit for you. Not all obstetrical practices offer waterbirth, so check with your healthcare provider.  Tour (Self-Paced Video) - [facility] Watch our 4 minute video tour of [facility] located in [place].   [place] Parenting Education Options:  Pregnancy 101 (Virtual) Congratulations on your pregnancy! This class is geared toward moms in their first trimester, but everyone is welcome. We are excited to guide you through all aspects of supporting a healthy pregnancy. You will learn what to expect at routine prenatal care appointments, common postpartum adjustments, basic infant safety, and breastfeeding.  Successful Partnering & Parenting ~ In-Person Workshop ([place]) This workshop inspires and equips partners of all economic levels, ages, and cultures to confidently care for their infants, support the birthing persons, and navigate their own transformations into new partners and parents. Learning activities are geared towards supporting partner, but moms are welcome to attend.  'Baby & Me' Parenting Group (Virtual on Wednesdays at 11am) Enjoy this time discussing newborn & infant parenting topics and family adjustment issues with other new parents in a relaxed environment. Each week brings a new speaker or baby-centered activity. This group offers support and connection to parents as they journey through the adjustments and struggles of that sometimes overwhelming first year after the birth of a child.  Baby Safety, CPR, & Choking Class ~ Virtual This life-saving information is meant to encourage parents as they learn important safety and prevention tips as well as infant CPR and relief of choking.  Breastfeeding Class (In-Person in [place] or Virtual) Families learn what to expect in the  first days and weeks of breastfeeding your   newborn. IF YOU ARE AN EMPLOYEE TAKING THIS CLASS FOR CREDIT, DO NOT register yourself. Please e-mail [email or web address].   Breastfeeding Self-Paced eClass (English & Spanish) Families learn what to expect in the first days and weeks of breastfeeding your newborn.  Caring for Baby ~ In-Person, Virtual or Self-Paced Class This in-person class is for both expectant and adoptive parents who want to learn and practice the most up-to-date newborn care for their babies. Focus is on birth through the first six weeks of life.  CPR & Choking Relief for Infants & Children ~ In-Person Class ([place]) This in-person course is designed for any parent, expectant parent, or adult who cares for infants or children. Participants learn and demonstrate cardiopulmonary resuscitation and choking relief procedures for both infants and children.  Grandparent Love ~ In-Person Class Grandparents will learn the most updated infant care and safety recommendations. They will discover ways to support their own children during the transition into the parenting role and receive tips on communicating with the new parents.  [place] Parenting Support Group Options:  Bereavement Grief Support Group (Pregnancy/Infant Loss) - Virtual This is an ongoing experience that meets once a month and is designed to help you honor the past, assist you in discovering tools to strengthen you today, and aid you in developing hope for the future.  Breastfeeding & Pumping Support Group (In-Person on Thursdays at 12pm or Virtual on Tuesdays at 5pm) Join us in-person each Thursday starting June 1st, 2023 at 12pm! This support group is free for all families looking for breastfeeding and/or pumping support.   Community-Based Childbirth Education Options:  [facility] Classes:  Childbirth education classes can help you get ready for a positive parenting experience. You  can also meet other expectant parents and get free stuff for your baby. Each class runs for five weeks on the same night and costs $45 for the mother-to-be and her support person. Medicaid covers the cost if you are eligible. Call [phone] to register.  [facility] offers a variety of programs for the [place] community and is another great way to get connected. Please go to [email or web address] for more information.  Childbirth With A Twist! Be informed of your options, get educated on birth, understand what your body is doing, learn how to cope, and have a lot of fun and laughs all while doing it either from the comfort of your couch OR in our cozy office and classroom space near the [place] airport. If you are taking a virtual class, then class is taught LIVE, so you can ask questions and receive answers in real-time from an experienced doula and childbirth educator.  This virtual childbirth education class will meet for five instruction times online.  Although we are based in [place], [place], this virtual class is open to anyone in the world. Please visit: [email or web address] for more information.  Books We Love: The Doula Guide to Childbirth by Ananda Lowe and Rachel Zimmerman The First-Time Parent's Childbirth Handbook by Dr. Stephanie Mitchell, CNM The Birth Partner by Penny Simkin  

## 2023-10-11 NOTE — Progress Notes (Signed)
   LOW-RISK PREGNANCY OFFICE VISIT  Patient name: Rachel Conley MRN [id number]  Date of birth: 02/16/1996 Chief Complaint:   Routine Prenatal Visit  Subjective:   Rachel Conley is a 27 y.o. G54P0000 female at [redacted]w[redacted]d with an Estimated Date of Delivery: 03/31/24 being seen today for ongoing management of a low-risk pregnancy aeb has Supervision of other normal pregnancy, antepartum on their problem list.  Patient presents today, with FOB, with nausea and vomiting. Patient contributes her continued symptoms to waiting too long to eat between meals.  She states currently it is every 3-4 hours, but her goal is every 2. Patient endorses fetal movement. Patient denies abdominal cramping or contractions.  Patient denies vaginal concerns including abnormal discharge, leaking of fluid, and bleeding. No issues with urination, constipation, or diarrhea.    Contractions: Not present. Vag. Bleeding: None.  Movement: Present.  Reviewed past medical,surgical, social, obstetrical and family history as well as problem list, medications and allergies.  Objective   Vitals:   10/11/23 1459  BP: (!) 146/86  Pulse: 99  Weight: 227 lb 9.6 oz (103.2 kg)  Body mass index is 40.32 kg/m.  Total Weight Gain:Not found.         Physical Examination:   General appearance: Well appearing, and in no distress  Mental status: Alert, oriented to person, place, and time  Skin: Warm & dry  Cardiovascular: Normal heart rate noted  Respiratory: Normal respiratory effort, no distress  Abdomen: Not assessed  Pelvic: Cervical exam deferred           Extremities: Edema: None  Fetal Status: Fetal Heart Rate (bpm): 159  Movement: Present   No results found for this or any previous visit (from the past 24 hour(s)).  Assessment & Plan:  Low-risk pregnancy of a 27 y.o., G1P0000 at [redacted]w[redacted]d with an Estimated Date of Delivery: 03/31/24   1. Supervision of other normal pregnancy, antepartum -Anticipatory guidance for upcoming  appts. -Patient to schedule next appt in 4-5 weeks for an in-person visit.   2. Nausea and vomiting in pregnancy -Encouraged high protein snacks between meals. -Patient has Diclegis and phenergan at home.   3. [redacted] weeks gestation of pregnancy -Doing well overall. -AFP today. Reviewed reasoning for this testing. -BP elevated, but no repeat performed. Will continue to monitor.   4. Obesity in pregnancy, antepartum -BMI 40.32 today -TWG 8lbs -Taking bASA as prescribed     Meds: No orders of the defined types were placed in this encounter.  Labs/procedures today:  Lab Orders         AFP, Serum, Open Spina Bifida      Reviewed: Preterm labor symptoms and general obstetric precautions including but not limited to vaginal bleeding, contractions, leaking of fluid and fetal movement were reviewed in detail with the patient.  All questions were answered.  Follow-up: No follow-ups on file.  Orders Placed This Encounter  Procedures   AFP, Serum, Open Spina Bifida   Cherre Robins MSN, CNM 10/11/2023

## 2023-10-11 NOTE — Progress Notes (Signed)
Pt presents for ROB visit. Pt c/o nausea and vomiting.

## 2023-10-13 LAB — AFP, SERUM, OPEN SPINA BIFIDA
AFP MoM: 1.67
AFP Value: 41.5 ng/mL
Gest. Age on Collection Date: 15 wk
Maternal Age At EDD: 26.3 a
OSBR Risk 1 IN: 3514
Test Results:: NEGATIVE
Weight: 227 [lb_av]

## 2023-11-07 ENCOUNTER — Ambulatory Visit: Payer: Medicaid Other

## 2023-11-07 ENCOUNTER — Telehealth: Payer: Self-pay | Admitting: *Deleted

## 2023-11-07 NOTE — Telephone Encounter (Signed)
Pt called to office stating she missed her u/s appt because she went to the wrong location.  Pt states MFM says 5 weeks before she can be rescheduled.  Pt made aware that is a very tight schedule and it may be several weeks before she can get rescheduled.  Pt advised she may continue to call back and see if any cancellations.  Pt also states she is planning to move back to [place] - advised she may want to look at finding a new provider there as that may take several weeks for an appt.  Pt also advised she may sign a release so that she may take records with her.

## 2023-11-08 ENCOUNTER — Ambulatory Visit (INDEPENDENT_AMBULATORY_CARE_PROVIDER_SITE_OTHER): Payer: Medicaid Other | Admitting: Obstetrics and Gynecology

## 2023-11-08 VITALS — BP 121/70 | HR 81 | Wt 230.8 lb

## 2023-11-08 DIAGNOSIS — O10919 Unspecified pre-existing hypertension complicating pregnancy, unspecified trimester: Secondary | ICD-10-CM | POA: Insufficient documentation

## 2023-11-08 DIAGNOSIS — O99210 Obesity complicating pregnancy, unspecified trimester: Secondary | ICD-10-CM

## 2023-11-08 DIAGNOSIS — Z3480 Encounter for supervision of other normal pregnancy, unspecified trimester: Secondary | ICD-10-CM

## 2023-11-08 DIAGNOSIS — Z3A19 19 weeks gestation of pregnancy: Secondary | ICD-10-CM

## 2023-11-08 NOTE — Progress Notes (Signed)
Pt presents for ROB visit. Will reschedule anatomy scan. No concerns

## 2023-11-10 NOTE — Progress Notes (Addendum)
HIGH-RISK PREGNANCY OFFICE VISIT Patient name: Rachel Conley MRN [id number]  Date of birth: 1996/05/09 Chief Complaint:   Routine Prenatal Visit  History of Present Illness:   Rachel Conley is a 27 y.o. G102P0000 female at [redacted]w[redacted]d with an Estimated Date of Delivery: 03/31/24 being seen today for ongoing management of a high-risk pregnancy complicated by newly diagnosed CHTN currently on no meds Today she reports  she will be moving to [place] the beginning of the year, but unable to find an OB provider that will take her "this far along" . Contractions: Not present. Vag. Bleeding: None.  Movement: Present. denies leaking of fluid.  Review of Systems:   Pertinent items are noted in HPI Denies abnormal vaginal discharge w/ itching/odor/irritation, headaches, visual changes, shortness of breath, chest pain, abdominal pain, severe nausea/vomiting, or problems with urination or bowel movements unless otherwise stated above. Pertinent History Reviewed:  Reviewed past medical,surgical, social, obstetrical and family history.  Reviewed problem list, medications and allergies. Physical Assessment:   Vitals:   11/08/23 1502  BP: 121/70  Pulse: 81  Weight: 230 lb 12.8 oz (104.7 kg)  Body mass index is 40.88 kg/m.           Physical Examination:   General appearance: alert, well appearing, and in no distress, oriented to person, place, and time, and overweight  Mental status: alert, oriented to person, place, and time, normal mood, behavior, speech, dress, motor activity, and thought processes  Skin: warm & dry   Extremities: Edema: None    Cardiovascular: normal heart rate noted  Respiratory: normal respiratory effort, no distress  Abdomen: gravid, soft, non-tender  Pelvic: Cervical exam deferred         Fetal Status: Fetal Heart Rate (bpm): 134   Movement: Present    Fetal Surveillance Testing today: none   No results found for this or any previous visit (from the past 24 hour(s)).   Assessment & Plan:  1) High-risk pregnancy G1P0000 at [redacted]w[redacted]d with an Estimated Date of Delivery: 03/31/24   2) Supervision of other normal pregnancy, antepartum - Discussed that she is NO LONGER a candidate for H2O birth with a new diagnosis of cHTN, even though her BP is WNL and requiring no meds at this time. - Missed anatomy U/S appointment due to going to wrong location - Advised to continue to look for OB provider closer to where she will be living, but continue to come to our office until she has completely transferred to avoid gaps in care  3) Chronic hypertension affecting pregnancy - New diagnosis - BPs at 9.4 wks (139/82), 11.4 wks (135/87)and 15.3 wks (146/86) were mildly elevated; which is c/w dx of cHTN - Needs baseline PEC labs at [facility] - Consider starting Labetalol 200 mg BID, if BPs are consistently elevated  4) Obesity in pregnancy, antepartum - Taking bASA daily  5) [redacted] weeks gestation of pregnancy   Meds: No orders of the defined types were placed in this encounter.   Labs/procedures today: none  Treatment Plan:  baseline PEC at [facility]  Reviewed: Preterm labor symptoms and general obstetric precautions including but not limited to vaginal bleeding, contractions, leaking of fluid and fetal movement were reviewed in detail with the patient.  All questions were answered. Has home bp cuff. Check bp weekly, let us know if >140/90.   Follow-up: Return in about 4 weeks (around 12/06/2023) for Return OB visit.  No orders of the defined types were placed in this encounter.  Rachel Mora MSN,  CNM 11/08/2023 3:37 PM

## 2023-12-07 ENCOUNTER — Encounter: Payer: Self-pay | Admitting: Obstetrics and Gynecology

## 2023-12-07 ENCOUNTER — Ambulatory Visit (INDEPENDENT_AMBULATORY_CARE_PROVIDER_SITE_OTHER): Payer: Medicaid Other | Admitting: Obstetrics and Gynecology

## 2023-12-07 VITALS — BP 120/79 | HR 101 | Wt 240.0 lb

## 2023-12-07 DIAGNOSIS — O10919 Unspecified pre-existing hypertension complicating pregnancy, unspecified trimester: Secondary | ICD-10-CM

## 2023-12-07 DIAGNOSIS — Z3480 Encounter for supervision of other normal pregnancy, unspecified trimester: Secondary | ICD-10-CM

## 2023-12-07 DIAGNOSIS — O99210 Obesity complicating pregnancy, unspecified trimester: Secondary | ICD-10-CM

## 2023-12-07 MED ORDER — PANTOPRAZOLE SODIUM 40 MG PO TBEC: 40.0000 mg | DELAYED_RELEASE_TABLET | Freq: Every day | ORAL | 3 refills | Status: AC

## 2023-12-07 NOTE — Progress Notes (Signed)
   PRENATAL VISIT NOTE  Subjective:  Rachel Conley is a 28 y.o. G1P0000 at [redacted]w[redacted]d being seen today for ongoing prenatal care.  She is currently monitored for the following issues for this high-risk pregnancy and has Supervision of other normal pregnancy, antepartum; Obesity in pregnancy, antepartum; and Chronic hypertension affecting pregnancy on their problem list.  Patient reports heartburn.   . Vag. Bleeding: None.  Movement: Present. Denies leaking of fluid.   The following portions of the patient's history were reviewed and updated as appropriate: allergies, current medications, past family history, past medical history, past social history, past surgical history and problem list.   Objective:   Vitals:   12/07/23 1546  BP: 120/79  Pulse: (!) 101  Weight: 240 lb (108.9 kg)    Fetal Status: Fetal Heart Rate (bpm): 154 Fundal Height: 24 cm Movement: Present     General:  Alert, oriented and cooperative. Patient is in no acute distress.  Skin: Skin is warm and dry. No rash noted.   Cardiovascular: Normal heart rate noted  Respiratory: Normal respiratory effort, no problems with respiration noted  Abdomen: Soft, gravid, appropriate for gestational age.  Pain/Pressure: Present     Pelvic: Cervical exam deferred        Extremities: Normal range of motion.  Edema: None  Mental Status: Normal mood and affect. Normal behavior. Normal judgment and thought content.   Assessment and Plan:  Pregnancy: G1P0000 at [redacted]w[redacted]d 1. Supervision of other normal pregnancy, antepartum (Primary) Patient is doing well complaining of heartburn Rx provided Third trimester labs and glucola next visit Follow up anatomy on 12/22/23  2. Chronic hypertension affecting pregnancy Normotensive without medication Continue ASA  3. Obesity in pregnancy, antepartum Discussed excess weight gain  Preterm labor symptoms and general obstetric precautions including but not limited to vaginal bleeding, contractions,  leaking of fluid and fetal movement were reviewed in detail with the patient. Please refer to After Visit Summary for other counseling recommendations.   Return in about 4 weeks (around 01/04/2024) for in person, ROB, 2 hr glucola next visit.  Future Appointments  Date Time Provider Department Center  12/22/2023  1:15 PM [facility] NURSE [facility]  12/22/2023  1:30 PM [facility] US[facility]    Winton Felt, MD

## 2023-12-07 NOTE — Patient Instructions (Signed)

## 2023-12-07 NOTE — Progress Notes (Signed)
 ROB, c/o heartburns and acid reflux.

## 2023-12-08 LAB — COMPREHENSIVE METABOLIC PANEL
ALT: 16 [IU]/L (ref 0–32)
AST: 18 [IU]/L (ref 0–40)
Albumin: 3.4 g/dL — ABNORMAL LOW (ref 4.0–5.0)
Alkaline Phosphatase: 98 [IU]/L (ref 44–121)
BUN/Creatinine Ratio: 13 (ref 9–23)
BUN: 8 mg/dL (ref 6–20)
Bilirubin Total: <0.2 mg/dL (ref 0.0–1.2)
CO2: 20 mmol/L (ref 20–29)
Calcium: 8.9 mg/dL (ref 8.7–10.2)
Chloride: 103 mmol/L (ref 96–106)
Creatinine, Ser: 0.62 mg/dL (ref 0.57–1.00)
Globulin, Total: 2.7 g/dL (ref 1.5–4.5)
Glucose: 84 mg/dL (ref 70–99)
Potassium: 4.1 mmol/L (ref 3.5–5.2)
Sodium: 136 mmol/L (ref 134–144)
Total Protein: 6.1 g/dL (ref 6.0–8.5)
eGFR: 126 mL/min/{1.73_m2} (ref >59)

## 2023-12-08 LAB — PROTEIN / CREATININE RATIO, URINE
Creatinine, Urine: 95 mg/dL
Protein, Ur: 10.9 mg/dL
Protein/Creat Ratio: 115 mg/g{creat} (ref 0–200)

## 2023-12-22 ENCOUNTER — Ambulatory Visit: Payer: Medicaid Other | Admitting: *Deleted

## 2023-12-22 ENCOUNTER — Encounter: Payer: Self-pay | Admitting: *Deleted

## 2023-12-22 ENCOUNTER — Ambulatory Visit: Payer: Medicaid Other | Attending: Obstetrics & Gynecology

## 2023-12-22 ENCOUNTER — Ambulatory Visit: Payer: Medicaid Other | Admitting: Maternal & Fetal Medicine

## 2023-12-22 ENCOUNTER — Other Ambulatory Visit: Payer: Self-pay | Admitting: *Deleted

## 2023-12-22 VITALS — BP 129/70 | HR 94

## 2023-12-22 DIAGNOSIS — O99210 Obesity complicating pregnancy, unspecified trimester: Secondary | ICD-10-CM

## 2023-12-22 DIAGNOSIS — Z3480 Encounter for supervision of other normal pregnancy, unspecified trimester: Secondary | ICD-10-CM | POA: Insufficient documentation

## 2023-12-22 DIAGNOSIS — O10919 Unspecified pre-existing hypertension complicating pregnancy, unspecified trimester: Secondary | ICD-10-CM

## 2023-12-22 DIAGNOSIS — Z3A25 25 weeks gestation of pregnancy: Secondary | ICD-10-CM | POA: Diagnosis not present

## 2023-12-22 DIAGNOSIS — E669 Obesity, unspecified: Secondary | ICD-10-CM | POA: Diagnosis not present

## 2023-12-22 DIAGNOSIS — Z3482 Encounter for supervision of other normal pregnancy, second trimester: Secondary | ICD-10-CM

## 2023-12-22 DIAGNOSIS — O99212 Obesity complicating pregnancy, second trimester: Secondary | ICD-10-CM | POA: Diagnosis not present

## 2023-12-22 DIAGNOSIS — O10012 Pre-existing essential hypertension complicating pregnancy, second trimester: Secondary | ICD-10-CM | POA: Diagnosis not present

## 2023-12-22 DIAGNOSIS — O10912 Unspecified pre-existing hypertension complicating pregnancy, second trimester: Secondary | ICD-10-CM | POA: Insufficient documentation

## 2023-12-22 DIAGNOSIS — Z363 Encounter for antenatal screening for malformations: Secondary | ICD-10-CM | POA: Insufficient documentation

## 2023-12-22 NOTE — Progress Notes (Signed)
   Patient information  Patient Name: Rachel Conley  Patient MRN:   [id number]  Referring practice: [facility] Referring Provider: [facility]  MFM CONSULT  Rachel Conley is a 28 y.o. G1P0000 at [redacted]w[redacted]d here for ultrasound and consultation. Patient Active Problem List   Diagnosis Date Noted   Chronic hypertension affecting pregnancy 11/08/2023   Obesity in pregnancy, antepartum 10/11/2023   Supervision of other normal pregnancy, antepartum 08/31/2023   Rachel Conley is doing well today with no acute concerns. She denies contractions, bleeding, or loss of fluid and reports good fetal movement.   RE CHTN: Controlled w/o meds. Discussed potential OB and fetal concerns and need for close monitoring.  I also discussed the risk of superimposed preeclampsia and the need for 81 mg of aspirin.  She is currently taking this and is compliant.  We discussed blood pressure goals of less than 140/90 with medication as needed.  Currently her blood pressure is well-controlled without medicine she has no acute complaints.  Sonographic findings Single intrauterine pregnancy at 25w 1d  Fetal cardiac activity:  Observed and appears normal. Presentation: Cephalic. The anatomic structures that were well seen appear normal without evidence of soft markers. Due to poor acoustic windows some structures remain suboptimally visualized. Fetal biometry shows the estimated fetal weight at the 79 percentile.  Amniotic fluid: Subjectively upper-normal.  MVP: 7.97 cm. Placenta: Posterior. Adnexa: No abnormality visualized. Cervical length: 3.6 cm.  There are limitations of prenatal ultrasound such as the inability to detect certain abnormalities due to poor visualization. Various factors such as fetal position, gestational age and maternal body habitus may increase the difficulty in visualizing the fetal anatomy.    Recommendations -EDD is Estimated Date of Delivery: 03/31/24. -Detailed ultrasound was done today  without abnormalities. -Continue Aspirin 81 mg for preeclampsia prophylaxis -Follow-up anatomy and fetal growth in 4 to 6 weeks -Serial growth ultrasounds starting around 28 weeks to monitor for fetal growth restriction -Antenatal testing to start around 32 weeks if antihypertensive medications are required to achieve blood pressure goal less than 140/90 -Delivery timing pending clinical course. -Continue routine prenatal care with referring OB provider  Review of Systems: A review of systems was performed and was negative except per HPI   Vitals and Physical Exam    12/22/2023    1:06 PM 12/07/2023    3:46 PM 11/08/2023    3:02 PM  Vitals with BMI  Weight  240 lbs 230 lbs 13 oz  Systolic 129 120 657  Diastolic 70 79 70  Pulse 94 101 81  Sitting comfortably on the sonogram table Nonlabored breathing Normal rate and rhythm Abdomen is nontender  Past pregnancies OB History  Gravida Para Term Preterm AB Living  1 0 0 0 0 0  SAB IAB Ectopic Multiple Live Births  0 0 0 0 0    # Outcome Date GA Lbr Len/2nd Weight Sex Type Anes PTL Lv  1 Current             I spent 30 minutes reviewing the patients chart, including labs and images as well as counseling the patient about her medical conditions. Greater than 50% of the time was spent in direct face-to-face patient counseling.  Braxton Feathers  MFM, [facility]   12/22/2023  2:32 PM

## 2023-12-26 ENCOUNTER — Telehealth: Payer: Self-pay

## 2023-12-26 NOTE — Telephone Encounter (Signed)
written order for Breast Pump to [facility]

## 2024-01-05 ENCOUNTER — Ambulatory Visit (INDEPENDENT_AMBULATORY_CARE_PROVIDER_SITE_OTHER): Payer: Medicaid Other | Admitting: Obstetrics and Gynecology

## 2024-01-05 ENCOUNTER — Other Ambulatory Visit: Payer: Medicaid Other

## 2024-01-05 VITALS — BP 126/76 | HR 90 | Wt 244.4 lb

## 2024-01-05 DIAGNOSIS — Z3480 Encounter for supervision of other normal pregnancy, unspecified trimester: Secondary | ICD-10-CM

## 2024-01-05 DIAGNOSIS — O10919 Unspecified pre-existing hypertension complicating pregnancy, unspecified trimester: Secondary | ICD-10-CM

## 2024-01-05 DIAGNOSIS — Z6838 Body mass index (BMI) 38.0-38.9, adult: Secondary | ICD-10-CM

## 2024-01-05 DIAGNOSIS — Z23 Encounter for immunization: Secondary | ICD-10-CM | POA: Diagnosis not present

## 2024-01-05 DIAGNOSIS — D649 Anemia, unspecified: Secondary | ICD-10-CM

## 2024-01-05 DIAGNOSIS — Z3A27 27 weeks gestation of pregnancy: Secondary | ICD-10-CM

## 2024-01-05 NOTE — Progress Notes (Signed)
   PRENATAL VISIT NOTE  Subjective:  Rachel Armentrout is a 28 y.o. G1P0000 at [redacted]w[redacted]d being seen today for ongoing prenatal care.  She is currently monitored for the following issues for this high-risk pregnancy and has Supervision of other normal pregnancy, antepartum; Obesity in pregnancy, antepartum; and Chronic hypertension affecting pregnancy on their problem list.  Patient reports no complaints.  Contractions: Not present. Vag. Bleeding: None.  Movement: Present. Denies leaking of fluid.   The following portions of the patient's history were reviewed and updated as appropriate: allergies, current medications, past family history, past medical history, past social history, past surgical history and problem list.   Objective:   Vitals:   01/05/24 0953  BP: 126/76  Pulse: 90  Weight: 244 lb 6.4 oz (110.9 kg)    Fetal Status: Fetal Heart Rate (bpm): 140   Movement: Present     General:  Alert, oriented and cooperative. Patient is in no acute distress.  Skin: Skin is warm and dry. No rash noted.   Cardiovascular: Normal heart rate noted  Respiratory: Normal respiratory effort, no problems with respiration noted  Abdomen: Soft, gravid, appropriate for gestational age.  Pain/Pressure: Absent      Assessment and Plan:  Pregnancy: G1P0000 at [redacted]w[redacted]d 1. Supervision of other normal pregnancy, antepartum (Primary) 2. [redacted] weeks gestation of pregnancy CBC, HIV, RPR today Lab visit next week for 2h GTT (pt chewed gum prior to appt) Tdap & flu today  3. Chronic hypertension affecting pregnancy 4. BMI 38.0-38.9,adult ldASA Normotensive, no current meds Normal baseline labs Growth [place] scheduled 2/21 Delivery at 39 weeks Reviewed si/sx preE  Preterm labor symptoms and general obstetric precautions including but not limited to vaginal bleeding, contractions, leaking of fluid and fetal movement were reviewed in detail with the patient. Please refer to After Visit Summary for other counseling  recommendations.   Return in about 1 week (around 01/12/2024) for lab visit for 2h GTT.  Future Appointments  Date Time Provider Department Center  01/26/2024  3:30 PM [facility] US3 [facility] [place]   Lennart Pall, MD

## 2024-01-05 NOTE — Addendum Note (Signed)
Addended by: Harrel Lemon on: 01/05/2024 10:46 AM   Modules accepted: Orders

## 2024-01-06 LAB — CBC
Hematocrit: 30 % — ABNORMAL LOW (ref 34.0–46.6)
Hemoglobin: 9.9 g/dL — ABNORMAL LOW (ref 11.1–15.9)
MCH: 30.7 pg (ref 26.6–33.0)
MCHC: 33 g/dL (ref 31.5–35.7)
MCV: 93 fL (ref 79–97)
Platelets: 188 10*3/uL (ref 150–450)
RBC: 3.23 x10E6/uL — ABNORMAL LOW (ref 3.77–5.28)
RDW: 11.9 % (ref 11.7–15.4)
WBC: 11.8 10*3/uL — ABNORMAL HIGH (ref 3.4–10.8)

## 2024-01-06 LAB — HIV ANTIBODY (ROUTINE TESTING W REFLEX): HIV Screen 4th Generation wRfx: NONREACTIVE

## 2024-01-06 LAB — RPR: RPR Ser Ql: NONREACTIVE

## 2024-01-08 ENCOUNTER — Encounter: Payer: Self-pay | Admitting: Obstetrics and Gynecology

## 2024-01-08 DIAGNOSIS — D649 Anemia, unspecified: Secondary | ICD-10-CM | POA: Insufficient documentation

## 2024-01-08 MED ORDER — FERROUS SULFATE 325 (65 FE) MG PO TABS: 325.0000 mg | ORAL_TABLET | ORAL | 1 refills | Status: AC

## 2024-01-08 NOTE — Addendum Note (Signed)
Addended by: Harvie Bridge on: 01/08/2024 07:28 AM   Modules accepted: Orders

## 2024-01-12 ENCOUNTER — Other Ambulatory Visit: Payer: Medicaid Other

## 2024-01-12 ENCOUNTER — Encounter: Payer: Medicaid Other | Admitting: Obstetrics & Gynecology

## 2024-01-12 ENCOUNTER — Ambulatory Visit (INDEPENDENT_AMBULATORY_CARE_PROVIDER_SITE_OTHER): Payer: Medicaid Other | Admitting: Obstetrics & Gynecology

## 2024-01-12 VITALS — BP 119/77 | HR 89 | Wt 241.0 lb

## 2024-01-12 DIAGNOSIS — Z3A28 28 weeks gestation of pregnancy: Secondary | ICD-10-CM

## 2024-01-12 DIAGNOSIS — Z3480 Encounter for supervision of other normal pregnancy, unspecified trimester: Secondary | ICD-10-CM | POA: Diagnosis not present

## 2024-01-12 NOTE — Progress Notes (Signed)
   PRENATAL VISIT NOTE  Subjective:  Rachel Conley is a 28 y.o. G1P0000 at [redacted]w[redacted]d being seen today for ongoing prenatal care.  She is currently monitored for the following issues for this low-risk pregnancy and has Supervision of other normal pregnancy, antepartum; Obesity in pregnancy, antepartum; Chronic hypertension affecting pregnancy; and Anemia on their problem list.  Patient reports no complaints.  Contractions: Not present. Vag. Bleeding: None.  Movement: Present. Denies leaking of fluid.   The following portions of the patient's history were reviewed and updated as appropriate: allergies, current medications, past family history, past medical history, past social history, past surgical history and problem list.   Objective:   Vitals:   01/12/24 0902  BP: 119/77  Pulse: 89  Weight: 241 lb (109.3 kg)    Fetal Status: Fetal Heart Rate (bpm): 140   Movement: Present     General:  Alert, oriented and cooperative. Patient is in no acute distress.  Skin: Skin is warm and dry. No rash noted.   Cardiovascular: Normal heart rate noted  Respiratory: Normal respiratory effort, no problems with respiration noted  Abdomen: Soft, gravid, appropriate for gestational age.  Pain/Pressure: Absent     Pelvic: Cervical exam deferred        Extremities: Normal range of motion.     Mental Status: Normal mood and affect. Normal behavior. Normal judgment and thought content.   Assessment and Plan:  Pregnancy: G1P0000 at [redacted]w[redacted]d 1. Supervision of other normal pregnancy, antepartum (Primary)  - Glucose Tolerance, 2 Hours w/1 Hour  2. [redacted] weeks gestation of pregnancy  - Glucose Tolerance, 2 Hours w/1 Hour  Preterm labor symptoms and general obstetric precautions including but not limited to vaginal bleeding, contractions, leaking of fluid and fetal movement were reviewed in detail with the patient. Please refer to After Visit Summary for other counseling recommendations.   Return in about 2  weeks (around 01/26/2024).  Future Appointments  Date Time Provider Department Center  01/12/2024  9:35 AM Eveline Lynwood MATSU, MD [facility] None  01/26/2024  3:30 PM [facility] US3 [facility]    Lynwood Eveline, MD

## 2024-01-13 LAB — GLUCOSE TOLERANCE, 2 HOURS W/ 1HR
Glucose, 1 hour: 147 mg/dL (ref 70–179)
Glucose, 2 hour: 91 mg/dL (ref 70–152)
Glucose, Fasting: 79 mg/dL (ref 70–91)

## 2024-01-25 NOTE — Progress Notes (Unsigned)
   PRENATAL VISIT NOTE  Subjective:  Rachel Conley is a 28 y.o. G1P0000 at [redacted]w[redacted]d being seen today for ongoing prenatal care.  She is currently monitored for the following issues for this {Blank single:19197::"high-risk","low-risk"} pregnancy and has Supervision of other normal pregnancy, antepartum; Obesity in pregnancy, antepartum; Chronic hypertension affecting pregnancy; and Anemia on their problem list.  Patient reports {sx:[id number]}.   .  .   . Denies leaking of fluid, vaginal bleeding, contractions. Endorses fetal movement.   The following portions of the patient's history were reviewed and updated as appropriate: allergies, current medications, past family history, past medical history, past social history, past surgical history and problem list.   Objective:  There were no vitals filed for this visit.  Fetal Status:           General:  Alert, oriented and cooperative. Patient is in no acute distress.  Skin: Skin is warm and dry. No rash noted.   Cardiovascular: Normal heart rate noted  Respiratory: Normal respiratory effort, no problems with respiration noted  Abdomen: Soft, gravid, appropriate for gestational age.        Pelvic: Cervical exam deferred        Extremities: Normal range of motion.     Mental Status: Normal mood and affect. Normal behavior. Normal judgment and thought content.   Assessment and Plan:  Pregnancy: G1P0000 at [redacted]w[redacted]d 1. Supervision of other normal pregnancy, antepartum (Primary) Patient doing well, fettling regular fetal movement  BP, FHR, FH appropriate   2. [redacted] weeks gestation of pregnancy Anticipatory guidance about next visits/weeks of pregnancy given.   3. Chronic hypertension affecting pregnancy BP *** Continue ASA  Serial growth U/S Delivery at 39 wks  4. Obesity in pregnancy, antepartum Normal 2hr GTT    Preterm labor symptoms and general obstetric precautions including but not limited to vaginal bleeding, contractions, leaking of fluid  and fetal movement were reviewed in detail with the patient.  Please refer to After Visit Summary for other counseling recommendations.   No follow-ups on file.  Future Appointments  Date Time Provider Department Center  01/26/2024 10:30 AM Ralene Muskrat, [place] [facility] None  01/26/2024  3:30 PM [facility]3 [facility]    Ralene Muskrat, PA-C

## 2024-01-26 ENCOUNTER — Encounter: Payer: Self-pay | Admitting: Physician Assistant

## 2024-01-26 ENCOUNTER — Other Ambulatory Visit: Payer: Self-pay | Admitting: *Deleted

## 2024-01-26 ENCOUNTER — Ambulatory Visit: Payer: Medicaid Other | Attending: Maternal & Fetal Medicine

## 2024-01-26 ENCOUNTER — Ambulatory Visit (INDEPENDENT_AMBULATORY_CARE_PROVIDER_SITE_OTHER): Payer: Medicaid Other | Admitting: Physician Assistant

## 2024-01-26 VITALS — BP 133/69 | HR 90 | Wt 251.3 lb

## 2024-01-26 DIAGNOSIS — O99210 Obesity complicating pregnancy, unspecified trimester: Secondary | ICD-10-CM

## 2024-01-26 DIAGNOSIS — O10919 Unspecified pre-existing hypertension complicating pregnancy, unspecified trimester: Secondary | ICD-10-CM | POA: Insufficient documentation

## 2024-01-26 DIAGNOSIS — E669 Obesity, unspecified: Secondary | ICD-10-CM | POA: Diagnosis not present

## 2024-01-26 DIAGNOSIS — O10013 Pre-existing essential hypertension complicating pregnancy, third trimester: Secondary | ICD-10-CM

## 2024-01-26 DIAGNOSIS — O99213 Obesity complicating pregnancy, third trimester: Secondary | ICD-10-CM

## 2024-01-26 DIAGNOSIS — Z3A30 30 weeks gestation of pregnancy: Secondary | ICD-10-CM

## 2024-01-26 DIAGNOSIS — Z3480 Encounter for supervision of other normal pregnancy, unspecified trimester: Secondary | ICD-10-CM | POA: Diagnosis not present

## 2024-01-26 DIAGNOSIS — O3660X0 Maternal care for excessive fetal growth, unspecified trimester, not applicable or unspecified: Secondary | ICD-10-CM | POA: Insufficient documentation

## 2024-01-26 DIAGNOSIS — O403XX0 Polyhydramnios, third trimester, not applicable or unspecified: Secondary | ICD-10-CM | POA: Diagnosis present

## 2024-01-26 DIAGNOSIS — O409XX0 Polyhydramnios, unspecified trimester, not applicable or unspecified: Secondary | ICD-10-CM

## 2024-01-26 DIAGNOSIS — O10913 Unspecified pre-existing hypertension complicating pregnancy, third trimester: Secondary | ICD-10-CM

## 2024-01-26 NOTE — Progress Notes (Addendum)
Pt presents for ROB. Pt lives in [place] now, but still plans to deliver at [facility]. Pt will continue traveling for [facility] and delivery. Pt has concerns about timing with her delivery and driving from [place] when time for delivery. Informed pt if she makes it to 39 weeks we will set up post dates induction for her so that she will have a date if baby does not come on his own before then.

## 2024-01-27 DIAGNOSIS — O403XX0 Polyhydramnios, third trimester, not applicable or unspecified: Secondary | ICD-10-CM | POA: Insufficient documentation

## 2024-01-27 DIAGNOSIS — O3660X0 Maternal care for excessive fetal growth, unspecified trimester, not applicable or unspecified: Secondary | ICD-10-CM | POA: Insufficient documentation

## 2024-01-29 ENCOUNTER — Telehealth: Payer: Self-pay

## 2024-01-29 NOTE — Telephone Encounter (Signed)
 APPT

## 2024-01-30 ENCOUNTER — Other Ambulatory Visit: Payer: Self-pay | Admitting: *Deleted

## 2024-01-30 DIAGNOSIS — O3660X0 Maternal care for excessive fetal growth, unspecified trimester, not applicable or unspecified: Secondary | ICD-10-CM

## 2024-01-30 DIAGNOSIS — O99210 Obesity complicating pregnancy, unspecified trimester: Secondary | ICD-10-CM

## 2024-02-01 ENCOUNTER — Encounter: Payer: Medicaid Other | Admitting: Obstetrics and Gynecology

## 2024-02-01 ENCOUNTER — Other Ambulatory Visit: Payer: Medicaid Other

## 2024-02-08 NOTE — Progress Notes (Signed)
   PRENATAL VISIT NOTE  Subjective:  Rachel Conley is a 28 y.o. G1P0000 at [redacted]w[redacted]d being seen today for ongoing prenatal care.  She is currently monitored for the following issues for this high-risk pregnancy and has Supervision of other normal pregnancy, antepartum; Obesity in pregnancy, antepartum; Chronic hypertension affecting pregnancy; Anemia; LGA (large for gestational age) fetus affecting mother, antepartum; and Polyhydramnios affecting pregnancy in third trimester on their problem list.  Patient reports no concerns.  Denies leaking of fluid, vaginal bleeding, contractions.  Endorses fetal movement.  The following portions of the patient's history were reviewed and updated as appropriate: allergies, current medications, past family history, past medical history, past social history, past surgical history and problem list.   Objective:   Vitals:   02/09/24 1050  BP: 123/72  Pulse: (!) 107  Weight: 252 lb 8 oz (114.5 kg)    Fetal Status: Fetal Heart Rate (bpm): 140   Movement: Present     General:  Alert, oriented and cooperative. Patient is in no acute distress.  Skin: Skin is warm and dry. No rash noted.   Cardiovascular: Normal heart rate noted  Respiratory: Normal respiratory effort, no problems with respiration noted  Abdomen: Soft, gravid, appropriate for gestational age.  Pain/Pressure: Present     Pelvic: Cervical exam deferred        Extremities: Normal range of motion.  Edema: Trace  Mental Status: Normal mood and affect. Normal behavior. Normal judgment and thought content.   Assessment and Plan:  Pregnancy: G1P0000 at [redacted]w[redacted]d 1. Supervision of other normal pregnancy, antepartum (Primary) Patient doing well, feeling regular fetal movement BP, FHR, FH appropriate  2. [redacted] weeks gestation of pregnancy Anticipatory guidance about next visits/weeks of pregnancy given.   3. Chronic hypertension affecting pregnancy Normotensive, no current meds Normal baseline  labs Growth [place] scheduled 02/23/2024 Continue ASA Delivery at 39 weeks  4. Obesity in pregnancy, antepartum 2hr gtt WNL  5. Anemia, unspecified type Hgb 9.9 two weeks ago, compliant on oral iron  Preterm labor symptoms and general obstetric precautions including but not limited to vaginal bleeding, contractions, leaking of fluid and fetal movement were reviewed in detail with the patient.  Please refer to After Visit Summary for other counseling recommendations.   No follow-ups on file.  Future Appointments  Date Time Provider Department Center  02/16/2024  3:15 PM [facility] NST [facility]  02/23/2024  8:35 AM Brock Bad, MD [facility] None  02/23/2024 11:30 AM [facility] US6 [facility]  03/01/2024  3:15 PM [facility] NST [facility]  03/08/2024  3:15 PM [facility] NST [facility]  03/15/2024  3:15 PM [facility] NST [facility]  03/22/2024  2:15 PM [facility] NST [facility]  03/22/2024  3:30 PM [facility] US2 [facility]    Ralene Muskrat, PA-C

## 2024-02-09 ENCOUNTER — Ambulatory Visit: Payer: Medicaid Other | Attending: Obstetrics and Gynecology | Admitting: *Deleted

## 2024-02-09 ENCOUNTER — Ambulatory Visit: Payer: Medicaid Other | Admitting: Physician Assistant

## 2024-02-09 VITALS — BP 123/72 | HR 107 | Wt 252.5 lb

## 2024-02-09 VITALS — BP 122/66

## 2024-02-09 DIAGNOSIS — Z3A32 32 weeks gestation of pregnancy: Secondary | ICD-10-CM

## 2024-02-09 DIAGNOSIS — O10913 Unspecified pre-existing hypertension complicating pregnancy, third trimester: Secondary | ICD-10-CM | POA: Insufficient documentation

## 2024-02-09 DIAGNOSIS — O99210 Obesity complicating pregnancy, unspecified trimester: Secondary | ICD-10-CM

## 2024-02-09 DIAGNOSIS — O10919 Unspecified pre-existing hypertension complicating pregnancy, unspecified trimester: Secondary | ICD-10-CM | POA: Diagnosis not present

## 2024-02-09 DIAGNOSIS — D649 Anemia, unspecified: Secondary | ICD-10-CM

## 2024-02-09 DIAGNOSIS — Z3480 Encounter for supervision of other normal pregnancy, unspecified trimester: Secondary | ICD-10-CM | POA: Diagnosis not present

## 2024-02-09 NOTE — Progress Notes (Signed)
 Pt presents for rob. Pt feels that she is coming down with a sinus infection but would like to be checked.

## 2024-02-09 NOTE — Procedures (Signed)
 Rachel Conley 12-05-1996 [redacted]w[redacted]d  Fetus A Non-Stress Test Interpretation for 02/09/24  NST only  Indication: Chronic Hypertenstion  Fetal Heart Rate A Mode: External Baseline Rate (A): 130 bpm Variability: Moderate Accelerations: 15 x 15 Decelerations: Variable Multiple birth?: No  Uterine Activity Mode: Palpation, Toco Contraction Frequency (min): occ with ui Contraction Duration (sec): 40 Contraction Quality: Mild Resting Tone Palpated: Relaxed Resting Time: Adequate  Interpretation (Fetal Testing) Nonstress Test Interpretation: Reactive Overall Impression: Reassuring for gestational age Comments: Dr. Judeth Cornfield reviewed tracing

## 2024-02-16 ENCOUNTER — Ambulatory Visit: Payer: Medicaid Other | Attending: Maternal & Fetal Medicine | Admitting: *Deleted

## 2024-02-16 DIAGNOSIS — O10913 Unspecified pre-existing hypertension complicating pregnancy, third trimester: Secondary | ICD-10-CM | POA: Diagnosis present

## 2024-02-16 DIAGNOSIS — Z3A33 33 weeks gestation of pregnancy: Secondary | ICD-10-CM | POA: Insufficient documentation

## 2024-02-16 DIAGNOSIS — O403XX0 Polyhydramnios, third trimester, not applicable or unspecified: Secondary | ICD-10-CM | POA: Insufficient documentation

## 2024-02-16 DIAGNOSIS — O3660X0 Maternal care for excessive fetal growth, unspecified trimester, not applicable or unspecified: Secondary | ICD-10-CM | POA: Insufficient documentation

## 2024-02-16 NOTE — Procedures (Signed)
 Rachel Conley 1996/07/01 [redacted]w[redacted]d  Fetus A Non-Stress Test Interpretation for 02/16/24  NST only  Indication: Chronic Hypertenstion, Poly  Fetal Heart Rate A Mode: External Baseline Rate (A): 135 bpm Variability: Moderate Accelerations: 15 x 15 Decelerations: None Multiple birth?: No  Uterine Activity Mode: Palpation, Toco Contraction Frequency (min): none Resting Tone Palpated: Relaxed  Interpretation (Fetal Testing) Nonstress Test Interpretation: Reactive Overall Impression: Reassuring for gestational age Comments: Dr. Parke Poisson reviewed tracing

## 2024-02-23 ENCOUNTER — Ambulatory Visit: Payer: Medicaid Other | Attending: Maternal & Fetal Medicine

## 2024-02-23 ENCOUNTER — Ambulatory Visit: Admitting: Obstetrics

## 2024-02-23 VITALS — BP 144/67 | HR 102

## 2024-02-23 VITALS — BP 128/77 | HR 118 | Wt 252.0 lb

## 2024-02-23 DIAGNOSIS — O403XX0 Polyhydramnios, third trimester, not applicable or unspecified: Secondary | ICD-10-CM

## 2024-02-23 DIAGNOSIS — O10913 Unspecified pre-existing hypertension complicating pregnancy, third trimester: Secondary | ICD-10-CM

## 2024-02-23 DIAGNOSIS — E669 Obesity, unspecified: Secondary | ICD-10-CM

## 2024-02-23 DIAGNOSIS — O3660X0 Maternal care for excessive fetal growth, unspecified trimester, not applicable or unspecified: Secondary | ICD-10-CM | POA: Diagnosis not present

## 2024-02-23 DIAGNOSIS — O99210 Obesity complicating pregnancy, unspecified trimester: Secondary | ICD-10-CM

## 2024-02-23 DIAGNOSIS — D649 Anemia, unspecified: Secondary | ICD-10-CM

## 2024-02-23 DIAGNOSIS — O0990 Supervision of high risk pregnancy, unspecified, unspecified trimester: Secondary | ICD-10-CM | POA: Diagnosis not present

## 2024-02-23 DIAGNOSIS — O10013 Pre-existing essential hypertension complicating pregnancy, third trimester: Secondary | ICD-10-CM

## 2024-02-23 DIAGNOSIS — O409XX0 Polyhydramnios, unspecified trimester, not applicable or unspecified: Secondary | ICD-10-CM

## 2024-02-23 DIAGNOSIS — J302 Other seasonal allergic rhinitis: Secondary | ICD-10-CM

## 2024-02-23 DIAGNOSIS — O99213 Obesity complicating pregnancy, third trimester: Secondary | ICD-10-CM

## 2024-02-23 DIAGNOSIS — Z3480 Encounter for supervision of other normal pregnancy, unspecified trimester: Secondary | ICD-10-CM

## 2024-02-23 DIAGNOSIS — Z3A34 34 weeks gestation of pregnancy: Secondary | ICD-10-CM | POA: Insufficient documentation

## 2024-02-23 DIAGNOSIS — O10919 Unspecified pre-existing hypertension complicating pregnancy, unspecified trimester: Secondary | ICD-10-CM

## 2024-02-23 MED ORDER — LORATADINE 10 MG PO TABS: 10.0000 mg | ORAL_TABLET | Freq: Every day | ORAL | 11 refills | Status: AC

## 2024-02-23 NOTE — Progress Notes (Signed)
 B/P elevated. Did not get B/P monitor yet.

## 2024-02-23 NOTE — Progress Notes (Signed)
 Subjective:  Rachel Conley is a 28 y.o. G1P0000 at [redacted]w[redacted]d being seen today for ongoing prenatal care.  She is currently monitored for the following issues for this high-risk pregnancy and has Supervision of other normal pregnancy, antepartum; Obesity in pregnancy, antepartum; Chronic hypertension affecting pregnancy; Anemia; LGA (large for gestational age) fetus affecting mother, antepartum; and Polyhydramnios affecting pregnancy in third trimester on their problem list.  Patient reports heartburn and seasonal rhinitis .  Contractions: Not present. Vag. Bleeding: None.  Movement: Present. Denies leaking of fluid.   The following portions of the patient's history were reviewed and updated as appropriate: allergies, current medications, past family history, past medical history, past social history, past surgical history and problem list. Problem list updated.  Objective:   Vitals:   02/23/24 0830 02/23/24 0858  BP: (!) 143/78 128/77  Pulse: (!) 118   Weight: 252 lb (114.3 kg)     Fetal Status:     Movement: Present     General:  Alert, oriented and cooperative. Patient is in no acute distress.  Skin: Skin is warm and dry. No rash noted.   Cardiovascular: Normal heart rate noted  Respiratory: Normal respiratory effort, no problems with respiration noted  Abdomen: Soft, gravid, appropriate for gestational age. Pain/Pressure: Present     Pelvic:  Cervical exam deferred        Extremities: Normal range of motion.  Edema: Trace  Mental Status: Normal mood and affect. Normal behavior. Normal judgment and thought content.   Urinalysis:      Assessment and Plan:  Pregnancy: G1P0000 at [redacted]w[redacted]d  1. Supervision of high risk pregnancy, antepartum (Primary)  2. Excessive fetal growth affecting pregnancy, antepartum, single or unspecified fetus - serial interval growth scans every 3-4 weeks - delivery at around 39 weeks, per MFM Dr. David Stall on 01/27/2024  3. Polyhydramnios affecting pregnancy in  third trimester - antenatal testing per MFM protocol  4. Anemia, unspecified type - clinically stable  5. Chronic hypertension affecting pregnancy - clinically stable  6. Obesity in pregnancy, antepartum  7. Seasonal allergies Rx: - loratadine (CLARITIN) 10 MG tablet; Take 1 tablet (10 mg total) by mouth daily.  Dispense: 30 tablet; Refill: 11    Preterm labor symptoms and general obstetric precautions including but not limited to vaginal bleeding, contractions, leaking of fluid and fetal movement were reviewed in detail with the patient. Please refer to After Visit Summary for other counseling recommendations.   Return in about 1 week (around 03/01/2024) for [facility]   Brock Bad, MD 02/23/2024

## 2024-03-01 ENCOUNTER — Encounter: Payer: Self-pay | Admitting: Nurse Practitioner

## 2024-03-01 ENCOUNTER — Other Ambulatory Visit (HOSPITAL_COMMUNITY)
Admission: RE | Admit: 2024-03-01 | Discharge: 2024-03-01 | Disposition: A | Discharge status: Home / Self Care | Source: Ambulatory Visit | Attending: Nurse Practitioner | Admitting: Nurse Practitioner

## 2024-03-01 ENCOUNTER — Ambulatory Visit: Payer: Medicaid Other | Attending: Maternal & Fetal Medicine | Admitting: *Deleted

## 2024-03-01 ENCOUNTER — Ambulatory Visit: Admitting: Nurse Practitioner

## 2024-03-01 VITALS — BP 121/77 | HR 103 | Wt 255.0 lb

## 2024-03-01 DIAGNOSIS — Z113 Encounter for screening for infections with a predominantly sexual mode of transmission: Secondary | ICD-10-CM | POA: Diagnosis present

## 2024-03-01 DIAGNOSIS — O403XX0 Polyhydramnios, third trimester, not applicable or unspecified: Secondary | ICD-10-CM | POA: Diagnosis present

## 2024-03-01 DIAGNOSIS — O10919 Unspecified pre-existing hypertension complicating pregnancy, unspecified trimester: Secondary | ICD-10-CM

## 2024-03-01 DIAGNOSIS — D649 Anemia, unspecified: Secondary | ICD-10-CM

## 2024-03-01 DIAGNOSIS — O3660X0 Maternal care for excessive fetal growth, unspecified trimester, not applicable or unspecified: Secondary | ICD-10-CM

## 2024-03-01 DIAGNOSIS — O10013 Pre-existing essential hypertension complicating pregnancy, third trimester: Secondary | ICD-10-CM | POA: Diagnosis not present

## 2024-03-01 DIAGNOSIS — Z3A35 35 weeks gestation of pregnancy: Secondary | ICD-10-CM | POA: Diagnosis present

## 2024-03-01 DIAGNOSIS — O10913 Unspecified pre-existing hypertension complicating pregnancy, third trimester: Secondary | ICD-10-CM | POA: Insufficient documentation

## 2024-03-01 DIAGNOSIS — O99210 Obesity complicating pregnancy, unspecified trimester: Secondary | ICD-10-CM

## 2024-03-01 DIAGNOSIS — Z118 Encounter for screening for other infectious and parasitic diseases: Secondary | ICD-10-CM | POA: Diagnosis present

## 2024-03-01 DIAGNOSIS — Z3480 Encounter for supervision of other normal pregnancy, unspecified trimester: Secondary | ICD-10-CM | POA: Diagnosis not present

## 2024-03-01 NOTE — Progress Notes (Signed)
 Pt presents for ROB visit. C/o vaginal pressure

## 2024-03-01 NOTE — Procedures (Signed)
 Rachel Conley [birth year] [redacted]w[redacted]d  Fetus A Non-Stress Test Interpretation for 03/01/24  NST only  Indication: Chronic Hypertenstion  Fetal Heart Rate A Mode: External Baseline Rate (A): 135 bpm Variability: Moderate Accelerations: 15 x 15 Decelerations: None Multiple birth?: No  Uterine Activity Mode: Palpation, Toco Contraction Frequency (min): none Resting Tone Palpated: Relaxed  Interpretation (Fetal Testing) Nonstress Test Interpretation: Reactive Overall Impression: Reassuring for gestational age Comments: Dr. Judeth Cornfield reviewed tracing

## 2024-03-01 NOTE — Addendum Note (Signed)
 Addended by: Jearld Adjutant on: 03/01/2024 11:35 AM   Modules accepted: Orders

## 2024-03-01 NOTE — Progress Notes (Signed)
   PRENATAL VISIT NOTE  Subjective:  Rachel Conley is a 28 y.o. G1P0000 at [redacted]w[redacted]d being seen today for ongoing prenatal care.  She is currently monitored for the following issues for this high-risk pregnancy and has Supervision of other normal pregnancy, antepartum; Obesity in pregnancy, antepartum; Chronic hypertension affecting pregnancy; Anemia; LGA (large for gestational age) fetus affecting mother, antepartum; and Polyhydramnios affecting pregnancy in third trimester on their problem list.  Patient reports no complaints.  Contractions: Not present. Vag. Bleeding: None.  Movement: Present. Denies leaking of fluid.   The following portions of the patient's history were reviewed and updated as appropriate: allergies, current medications, past family history, past medical history, past social history, past surgical history and problem list.   Objective:   Vitals:   03/01/24 0851  BP: 121/77  Pulse: (!) 103  Weight: 255 lb (115.7 kg)    Vitals:   03/01/24 0851  BP: 121/77  Pulse: (!) 103  Weight: 255 lb (115.7 kg)     Fetal Status: Fetal Heart Rate (bpm): 140 Fundal Height: 37 cm Movement: Present       General:  Alert, oriented and cooperative. Patient is in no acute distress.  Skin: Skin is warm and dry. No rash noted.   Cardiovascular: Normal heart rate noted  Respiratory: Normal respiratory effort, no problems with respiration noted  Abdomen: Soft, gravid, appropriate for gestational age.  Pain/Pressure: Present     Pelvic: Cervical exam deferred       Swabs only obtained with RN Chaperone  Extremities: Normal range of motion.  Edema: Trace  Mental Status: Normal mood and affect. Normal behavior. Normal judgment and thought content.   Assessment and Plan:  Pregnancy: G1P0000 at [redacted]w[redacted]d  1. [redacted] weeks gestation of pregnancy (Primary) - Culture, beta strep (group b only)  2. Supervision of other normal pregnancy, antepartum  3. Chronic hypertension affecting pregnancy  4.  Polyhydramnios affecting pregnancy in third trimester  5. Excessive fetal growth affecting pregnancy, antepartum, single or unspecified fetus  6. Obesity in pregnancy, antepartum  7. Anemia, unspecified type     - BP's stable - FHR appropriate - FH 37 (LGA 97% on 02/23/24 sono ) followed by MFM - Next appointment 03/21/24 - Anemia - taking Ferrous Sulfate   Preterm labor symptoms and general obstetric precautions including but not limited to vaginal bleeding, contractions, leaking of fluid and fetal movement were reviewed in detail with the patient. Please refer to After Visit Summary for other counseling recommendations.   Return in about 1 week (around 03/08/2024) for [facility].  Future Appointments  Date Time Provider Department Center  03/01/2024  3:15 PM [facility] NST [facility]  03/08/2024  3:15 PM [facility] NST [facility]  03/15/2024  3:15 PM [facility] NST [facility]  03/21/2024  2:15 PM [facility] NST [facility]  03/21/2024  3:30 PM [facility] US5 [facility]    Colman Cater, NP

## 2024-03-04 ENCOUNTER — Encounter: Payer: Self-pay | Admitting: Nurse Practitioner

## 2024-03-04 LAB — CERVICOVAGINAL ANCILLARY ONLY
Chlamydia: NEGATIVE
Comment: NEGATIVE
Comment: NEGATIVE
Comment: NORMAL
Neisseria Gonorrhea: NEGATIVE
Trichomonas: NEGATIVE

## 2024-03-04 LAB — CULTURE, BETA STREP (GROUP B ONLY): Strep Gp B Culture: POSITIVE — AB

## 2024-03-08 ENCOUNTER — Ambulatory Visit (INDEPENDENT_AMBULATORY_CARE_PROVIDER_SITE_OTHER): Admitting: Obstetrics and Gynecology

## 2024-03-08 ENCOUNTER — Encounter: Payer: Self-pay | Admitting: Obstetrics and Gynecology

## 2024-03-08 ENCOUNTER — Ambulatory Visit: Payer: Medicaid Other | Attending: Maternal & Fetal Medicine | Admitting: *Deleted

## 2024-03-08 VITALS — BP 111/74 | HR 101 | Wt 252.0 lb

## 2024-03-08 VITALS — BP 130/68

## 2024-03-08 DIAGNOSIS — Z3A36 36 weeks gestation of pregnancy: Secondary | ICD-10-CM | POA: Insufficient documentation

## 2024-03-08 DIAGNOSIS — D509 Iron deficiency anemia, unspecified: Secondary | ICD-10-CM

## 2024-03-08 DIAGNOSIS — O3660X0 Maternal care for excessive fetal growth, unspecified trimester, not applicable or unspecified: Secondary | ICD-10-CM | POA: Diagnosis not present

## 2024-03-08 DIAGNOSIS — O403XX0 Polyhydramnios, third trimester, not applicable or unspecified: Secondary | ICD-10-CM | POA: Diagnosis not present

## 2024-03-08 DIAGNOSIS — O10913 Unspecified pre-existing hypertension complicating pregnancy, third trimester: Secondary | ICD-10-CM | POA: Insufficient documentation

## 2024-03-08 DIAGNOSIS — O10919 Unspecified pre-existing hypertension complicating pregnancy, unspecified trimester: Secondary | ICD-10-CM

## 2024-03-08 DIAGNOSIS — O99210 Obesity complicating pregnancy, unspecified trimester: Secondary | ICD-10-CM

## 2024-03-08 DIAGNOSIS — Z3480 Encounter for supervision of other normal pregnancy, unspecified trimester: Secondary | ICD-10-CM | POA: Diagnosis not present

## 2024-03-08 DIAGNOSIS — O3663X0 Maternal care for excessive fetal growth, third trimester, not applicable or unspecified: Secondary | ICD-10-CM | POA: Diagnosis not present

## 2024-03-08 NOTE — Patient Instructions (Signed)

## 2024-03-08 NOTE — Progress Notes (Signed)
   PRENATAL VISIT NOTE  Subjective:  Rachel Conley is a 28 y.o. G1P0000 at [redacted]w[redacted]d being seen today for ongoing prenatal care.  She is currently monitored for the following issues for this high-risk pregnancy and has Supervision of other normal pregnancy, antepartum; Obesity in pregnancy, antepartum; Chronic hypertension affecting pregnancy; Anemia; LGA (large for gestational age) fetus affecting mother, antepartum; and Polyhydramnios affecting pregnancy in third trimester on their problem list.  Patient reports no complaints.  Contractions: Irritability. Vag. Bleeding: None.  Movement: Present. Denies leaking of fluid.   The following portions of the patient's history were reviewed and updated as appropriate: allergies, current medications, past family history, past medical history, past social history, past surgical history and problem list.   Objective:   Vitals:   03/08/24 0858  BP: 111/74  Pulse: (!) 101  Weight: 252 lb (114.3 kg)    Fetal Status: Fetal Heart Rate (bpm): 148 Fundal Height: 38 cm Movement: Present     General:  Alert, oriented and cooperative. Patient is in no acute distress.  Skin: Skin is warm and dry. No rash noted.   Cardiovascular: Normal heart rate noted  Respiratory: Normal respiratory effort, no problems with respiration noted  Abdomen: Soft, gravid, appropriate for gestational age.  Pain/Pressure: Present     Pelvic: Cervical exam performed in the presence of a chaperone Dilation: Closed Effacement (%): Thick Station: Ballotable  Extremities: Normal range of motion.  Edema: Trace  Mental Status: Normal mood and affect. Normal behavior. Normal judgment and thought content.   Assessment and Plan:  Pregnancy: G1P0000 at [redacted]w[redacted]d 1. Supervision of other normal pregnancy, antepartum (Primary) Patient is doing well without complaints Undecided on contraception  2. Chronic hypertension affecting pregnancy Stable without medication Continue ASA Will plan IOL  for 39 weeks  3. Polyhydramnios affecting pregnancy in third trimester Continue weekly antenatal testing  4. Obesity in pregnancy, antepartum   5. Excessive fetal growth affecting pregnancy, antepartum, single or unspecified fetus   6. Iron deficiency anemia, unspecified iron deficiency anemia type Continue iron supplement  Preterm labor symptoms and general obstetric precautions including but not limited to vaginal bleeding, contractions, leaking of fluid and fetal movement were reviewed in detail with the patient. Please refer to After Visit Summary for other counseling recommendations.   Return in about 1 week (around 03/15/2024) for in person, ROB, High risk.  Future Appointments  Date Time Provider Department Center  03/08/2024  3:15 PM [facility]ST [facility]  03/15/2024  3:15 PM [facility] NST [facility]  03/21/2024  2:15 PM [facility] NST [facility]  03/21/2024  3:30 PM [facility] US5 [facility]    Catalina Antigua, MD

## 2024-03-08 NOTE — Procedures (Signed)
 Rachel Conley 11-29-1996 [redacted]w[redacted]d  Fetus A Non-Stress Test Interpretation for 03/08/24  NST only  Indication: Chronic Hypertenstion  Fetal Heart Rate A Mode: External Baseline Rate (A): 135 bpm Variability: Moderate Accelerations: 15 x 15 Decelerations: None Multiple birth?: No  Uterine Activity Mode: Palpation, Toco Contraction Frequency (min): none Resting Tone Palpated: Relaxed  Interpretation (Fetal Testing) Nonstress Test Interpretation: Reactive Overall Impression: Reassuring for gestational age Comments: Dr. Parke Poisson reviewed tracing

## 2024-03-11 ENCOUNTER — Telehealth (HOSPITAL_COMMUNITY): Payer: Self-pay | Admitting: *Deleted

## 2024-03-11 ENCOUNTER — Encounter (HOSPITAL_COMMUNITY): Payer: Self-pay

## 2024-03-11 NOTE — Telephone Encounter (Signed)
 Preadmission screen

## 2024-03-12 ENCOUNTER — Encounter (HOSPITAL_COMMUNITY): Payer: Self-pay | Admitting: *Deleted

## 2024-03-12 ENCOUNTER — Telehealth (HOSPITAL_COMMUNITY): Payer: Self-pay | Admitting: *Deleted

## 2024-03-12 NOTE — Telephone Encounter (Signed)
 Preadmission screen

## 2024-03-15 ENCOUNTER — Ambulatory Visit: Admitting: Obstetrics

## 2024-03-15 ENCOUNTER — Ambulatory Visit: Payer: Medicaid Other | Attending: Maternal & Fetal Medicine | Admitting: *Deleted

## 2024-03-15 VITALS — BP 131/66

## 2024-03-15 DIAGNOSIS — O10913 Unspecified pre-existing hypertension complicating pregnancy, third trimester: Secondary | ICD-10-CM | POA: Diagnosis present

## 2024-03-15 DIAGNOSIS — Z3A37 37 weeks gestation of pregnancy: Secondary | ICD-10-CM

## 2024-03-15 DIAGNOSIS — O10919 Unspecified pre-existing hypertension complicating pregnancy, unspecified trimester: Secondary | ICD-10-CM

## 2024-03-15 DIAGNOSIS — Z3480 Encounter for supervision of other normal pregnancy, unspecified trimester: Secondary | ICD-10-CM

## 2024-03-15 NOTE — Procedures (Signed)
 Rachel Conley [birth year] [redacted]w[redacted]d  Fetus A Non-Stress Test Interpretation for 03/15/24  Indication: Chronic Hypertenstion  Fetal Heart Rate A Mode: External Baseline Rate (A): 130 bpm Variability: Moderate Accelerations: 15 x 15 Decelerations: None Multiple birth?: No  Uterine Activity Mode: Palpation, Toco Contraction Frequency (min): none Resting Tone Palpated: Relaxed  Interpretation (Fetal Testing) Nonstress Test Interpretation: Reactive Overall Impression: Reassuring for gestational age Comments: Dr. Parke Poisson reviewed tracing

## 2024-03-18 ENCOUNTER — Telehealth: Admitting: Obstetrics and Gynecology

## 2024-03-18 VITALS — BP 128/79 | Wt 252.0 lb

## 2024-03-18 DIAGNOSIS — O10919 Unspecified pre-existing hypertension complicating pregnancy, unspecified trimester: Secondary | ICD-10-CM

## 2024-03-18 DIAGNOSIS — O3660X0 Maternal care for excessive fetal growth, unspecified trimester, not applicable or unspecified: Secondary | ICD-10-CM | POA: Diagnosis not present

## 2024-03-18 DIAGNOSIS — Z3A38 38 weeks gestation of pregnancy: Secondary | ICD-10-CM

## 2024-03-18 DIAGNOSIS — O99210 Obesity complicating pregnancy, unspecified trimester: Secondary | ICD-10-CM | POA: Diagnosis not present

## 2024-03-18 DIAGNOSIS — O403XX0 Polyhydramnios, third trimester, not applicable or unspecified: Secondary | ICD-10-CM

## 2024-03-18 DIAGNOSIS — Z3480 Encounter for supervision of other normal pregnancy, unspecified trimester: Secondary | ICD-10-CM

## 2024-03-18 NOTE — Patient Instructions (Signed)
   Considering Waterbirth? Guide for patients at [facility]) Why consider waterbirth? Gentle birth for babies  Less pain medicine used in labor  May allow for passive descent/less pushing  May reduce perineal tears  More mobility and instinctive maternal position changes  Increased maternal relaxation   Is waterbirth safe? What are the risks of infection, drowning or other complications? Infection:  Very low risk (3.7 % for tub vs 4.8% for bed)  7 in 8000 waterbirths with documented infection  Poorly cleaned equipment most common cause  Slightly lower group B strep transmission rate  Drowning  Maternal:  Very low risk  Related to seizures or fainting  Newborn:  Very low risk. No evidence of increased risk of respiratory problems in multiple large studies  Physiological protection from breathing under water  Avoid underwater birth if there are any fetal complications  Once baby's head is out of the water, keep it out.  Birth complication  Some reports of cord trauma, but risk decreased by bringing baby to surface gradually  No evidence of increased risk of shoulder dystocia. Mothers can usually change positions faster in water than in a bed, possibly aiding the maneuvers to free the shoulder.   There are 2 things you MUST do to have a waterbirth with [facility]: Attend a waterbirth class at [facility]   3rd Wednesday of every month from 7-9 pm (virtual during COVID) [employer] at [email or web address] or [email or web address] or by calling [phone] Bring [place] the certificate from the class to your prenatal appointment or send via MyChart Meet with a midwife at 36 weeks* to see if you can still plan a waterbirth and to sign the consent.   *We also recommend that you schedule as many of your prenatal visits with a midwife as possible.    Helpful information: You may want to bring a bathing suit top to the hospital  to wear during labor but this is optional.  All other supplies are provided by the hospital. Please arrive at the hospital with signs of active labor, and do not wait at home until late in labor. It takes 45 min- 1 hour for fetal monitoring, and check in to your room to take place, plus transport and filling of the waterbirth tub.    Things that would prevent you from having a waterbirth: Premature, <37wks  Previous cesarean birth  Presence of thick meconium-stained fluid  Multiple gestation (Twins, triplets, etc.)  Uncontrolled diabetes or gestational diabetes requiring medication  Hypertension diagnosed in pregnancy or preexisting hypertension (gestational hypertension, preeclampsia, or chronic hypertension) Fetal growth restriction (your baby measures less than 10th percentile on ultrasound) Heavy vaginal bleeding  Non-reassuring fetal heart rate  Active infection (MRSA, etc.). Group B Strep is NOT a contraindication for waterbirth.  If your labor has to be induced and induction method requires continuous monitoring of the baby's heart rate  Other risks/issues identified by your obstetrical provider   Please remember that birth is unpredictable. Under certain unforeseeable circumstances your provider may advise against giving birth in the tub. These decisions will be made on a case-by-case basis and with the safety of you and your baby as our highest priority.    Updated 03/09/22

## 2024-03-18 NOTE — Progress Notes (Addendum)
 OBSTETRICS PRENATAL VIRTUAL VISIT ENCOUNTER NOTE  [facility]   Patient location: Home  I connected with Rachel Conley on 03/18/24 at 10:55 AM EDT by MyChart Video Encounter and verified that I am speaking with the correct person using two identifiers. I discussed the limitations, risks, security and privacy concerns of performing an evaluation and management service virtually and the availability of in person appointments. I also discussed with the patient that there may be a patient responsible charge related to this service. The patient expressed understanding and agreed to proceed. Subjective:  Rachel Conley is a 28 y.o. G1P0000 at [redacted]w[redacted]d being seen today for ongoing prenatal care.  She is currently monitored for the following issues for this high-risk pregnancy and has Supervision of other normal pregnancy, antepartum; Obesity in pregnancy, antepartum; Chronic hypertension affecting pregnancy; Anemia; LGA (large for gestational age) fetus affecting mother, antepartum; and Polyhydramnios affecting pregnancy in third trimester on their problem list.  Patient reports occasional contractions.   .  .   . Denies any leaking of fluid.   The following portions of the patient's history were reviewed and updated as appropriate: allergies, current medications, past family history, past medical history, past social history, past surgical history and problem list.   Objective:   Vitals:   03/18/24 1056  BP: 128/79  Weight: 252 lb (114.3 kg)    Fetal Status:           General:  Alert, oriented and cooperative. Patient is in no acute distress.  Respiratory: Normal respiratory effort, no problems with respiration noted  Mental Status: Normal mood and affect. Normal behavior. Normal judgment and thought content.  Rest of physical exam deferred due to type of encounter  Imaging: [place] MFM OB FOLLOW UP Result Date:  02/23/2024 ----------------------------------------------------------------------  OBSTETRICS REPORT                       (Signed Final 02/23/2024 12:19 pm) ---------------------------------------------------------------------- Patient Info  ID #:       [id number]                          D.O.B.:  1996/02/18 (27 yrs)(F)  Name:       Rachel Conley                  Visit Date: 02/23/2024 11:26 am ---------------------------------------------------------------------- Performed By  Attending:        Ma Rings MD         Ref. Address:     [address]                                                             [place], [place]  [address]  Performed By:     Reinaldo Raddle            [facility]  Referred By:      [facility] ---------------------------------------------------------------------- Orders  #  Description                           Code        Ordered By  1  [place] MFM OB FOLLOW UP                   76816.01    BURK SCHAIBLE  2  [place] MFM FETAL BPP WO NON               76819.01    [facility]     STRESS ----------------------------------------------------------------------  #  Order #                     Accession #                Episode #  1  [id number]                   [id number]                 [id number]  2  [id number]                   [id number]                 [id number] ---------------------------------------------------------------------- Indications  Obesity complicating pregnancy, third          O99.213  trimester (BMI 38)  Hypertension - Chronic/Pre-existing            O10.019  Polyhydramnios, third trimester, antepartum    O40.3XX0  condition or  complication, unspecified fetus  [redacted] weeks gestation of pregnancy                Z3A.34  LR NIPS - Female, Negative Horizon, Negative  AFP, 2hr GTT WNL ---------------------------------------------------------------------- Fetal Evaluation  Num Of Fetuses:         1  Fetal Heart Rate(bpm):  136  Cardiac Activity:       Observed  Presentation:           Cephalic  Placenta:  Posterior  P. Cord Insertion:      Previously seen  Amniotic Fluid  AFI FV:      Polyhydramnios  AFI Sum(cm)     %Tile       Largest Pocket(cm)  31.18           > 97        11.14  RUQ(cm)       RLQ(cm)       LUQ(cm)        LLQ(cm)  11.14         7.51          2.55           9.98 ---------------------------------------------------------------------- Biophysical Evaluation  Amniotic F.V:   Within normal limits       F. Tone:        Observed  F. Movement:    Observed                   Score:          8/8  F. Breathing:   Observed ---------------------------------------------------------------------- Biometry  BPD:      86.3  mm     G. Age:  34w 6d         68  %    CI:        73.59   %    70 - 86                                                          FL/HC:      21.6   %    19.4 - 21.8  HC:      319.6  mm     G. Age:  36w 0d         63  %    HC/AC:      0.97        0.96 - 1.11  AC:      329.9  mm     G. Age:  36w 6d         99  %    FL/BPD:     79.8   %    71 - 87  FL:       68.9  mm     G. Age:  35w 3d         73  %    FL/AC:      20.9   %    20 - 24  HUM:      60.7  mm     G. Age:  35w 1d         79  %  Est. FW:    2871  gm      6 lb 5 oz     93  % ---------------------------------------------------------------------- OB History  Blood Type:   A+  Maternal Racial/Ethnic Group:   Black (non-Hispanic)  Gravidity:    1         Term:   0        Prem:   0        SAB:   0  TOP:          0       Ectopic:  0  Living: 0 ---------------------------------------------------------------------- Gestational Age  LMP:           34w 5d        Date:   06/25/23                  EDD:   03/31/24  U/S Today:     35w 6d                                        EDD:   03/23/24  Best:          34w 1d     Det. By:  U/S C R L  (08/31/23)    EDD:   04/04/24 ---------------------------------------------------------------------- Targeted Anatomy  Central Nervous System  Calvarium/Cranial V.:  Appears normal         Cereb./Vermis:          Previously seen  Cavum:                 Appears normal         Cisterna Magna:         Previously seen  Lateral Ventricles:    Previously seen        Midline Falx:           Previously seen  Choroid Plexus:        Previously seen  Spine  Cervical:              Previously seen        Sacral:                 Previously seen  Thoracic:              Previously seen        Shape/Curvature:        Previously seen  Lumbar:                Previously seen  Head/Neck  Lips:                  Previously seen        Profile:                Appears normal  Neck:                  Previously seen        Orbits/Eyes:            Previously seen  Nuchal Fold:           Previously seen        Mandible:               Previously seen  Nasal Bone:            Previously seen        Maxilla:                Previously seen  Thorax  4 Chamber View:        Previously seen        SVC:                    Previously seen  Cardiac Activity:      Observed               Interventr. Septum:     Previously seen  Cardiac Rhythm:  Normal                 Cardiac Axis:           Normal  Cardiac Situs:         Previously seen        Diaphragm:              Appears normal  Rt Outflow Tract:      Previously seen        3 Vessel View:          Previously seen  Lt Outflow Tract:      Previously seen        3 V Trachea View:       Previously seen  Aortic Arch:           Previously seen        IVC:                    Previously seen  Ductal Arch:           Not well visualized    Crossing:               Previously seen  Abdomen  Ventral Wall:          Previously seen        Lt Kidney:               Appears normal  Cord Insertion:        Previously seen        Rt Kidney:              Appears normal  Situs:                 Previously seen        Bladder:                Appears normal  Stomach:               Appears normal  Extremities  Lt Humerus:            Previously seen        Lt Femur:               Previously seen  Rt Humerus:            Previously seen        Rt Femur:               Previously seen  Lt Forearm:            Previously seen        Lt Lower Leg:           Previously seen  Rt Forearm:            Previously seen        Rt Lower Leg:           Previously seen  Lt Hand:               Visualized limited     Lt Foot:                Previously seen  Rt Hand:               Visualized limited     Rt Foot:                Previously seen  Other  Umbilical Cord:  Previously seen        Genitalia:              Female-nml  Comment:     Technically difficult due to fetal position. ---------------------------------------------------------------------- Cervix Uterus Adnexa  Cervix  Not visualized (advanced GA >24wks) ---------------------------------------------------------------------- Comments  This patient has been followed due to chronic hypertension  and polyhydramnios.  She denies any problems since her last  exam.  The overall EFW of 6 pounds 5 ounces measures at the 93rd  percentile for her gestational age.  Polyhydramnios with a  total AFI of 31.18 cm continues to be noted today.  Due to polyhydramnios, she will continue weekly NSTs in  your office for the next 3 weeks.  She will return to our office for another growth scan and BPP  in 4 weeks. ----------------------------------------------------------------------                   Ma Rings, MD Electronically Signed Final Report   02/23/2024 12:19 pm ----------------------------------------------------------------------   [place] MFM FETAL BPP WO NON STRESS Result Date:  02/23/2024 ----------------------------------------------------------------------  OBSTETRICS REPORT                       (Signed Final 02/23/2024 12:19 pm) ---------------------------------------------------------------------- Patient Info  ID #:       [id number]                          D.O.B.:  1996-11-24 (27 yrs)(F)  Name:       Rachel Conley                  Visit Date: 02/23/2024 11:26 am ---------------------------------------------------------------------- Performed By  Attending:        Ma Rings MD         Ref. Address:     [address]                                                             [place], [place]                                                             [address]  Performed By:     Reinaldo Raddle            [facility]                                     [facility] at                                                             [facility]  Referred By:      [facility] ---------------------------------------------------------------------- Orders  #  Description                           Code        Ordered By  1  [place] MFM OB FOLLOW UP                   76816.01    BURK SCHAIBLE  2  [place] MFM FETAL BPP WO NON               76819.01    [facility]     STRESS ----------------------------------------------------------------------  #  Order #                     Accession #                Episode #  1  [id number]                   [id number]                 [id number]  2  [id number]                   [id number]                 [id number] ---------------------------------------------------------------------- Indications  Obesity complicating pregnancy, third          O99.213  trimester (BMI 38)  Hypertension - Chronic/Pre-existing            O10.019  Polyhydramnios, third trimester, antepartum    O40.3XX0  condition or  complication, unspecified fetus  [redacted] weeks gestation of pregnancy                Z3A.34  LR NIPS - Female, Negative Horizon, Negative  AFP, 2hr GTT WNL ---------------------------------------------------------------------- Fetal Evaluation  Num Of Fetuses:         1  Fetal Heart Rate(bpm):  136  Cardiac Activity:       Observed  Presentation:           Cephalic  Placenta:               Posterior  P. Cord Insertion:      Previously seen  Amniotic Fluid  AFI FV:      Polyhydramnios  AFI Sum(cm)     %Tile       Largest Pocket(cm)  31.18           > 97        11.14  RUQ(cm)       RLQ(cm)       LUQ(cm)  LLQ(cm)  11.14         7.51          2.55           9.98 ---------------------------------------------------------------------- Biophysical Evaluation  Amniotic F.V:   Within normal limits       F. Tone:        Observed  F. Movement:    Observed                   Score:          8/8  F. Breathing:   Observed ---------------------------------------------------------------------- Biometry  BPD:      86.3  mm     G. Age:  34w 6d         68  %    CI:        73.59   %    70 - 86                                                          FL/HC:      21.6   %    19.4 - 21.8  HC:      319.6  mm     G. Age:  36w 0d         63  %    HC/AC:      0.97        0.96 - 1.11  AC:      329.9  mm     G. Age:  36w 6d         99  %    FL/BPD:     79.8   %    71 - 87  FL:       68.9  mm     G. Age:  35w 3d         73  %    FL/AC:      20.9   %    20 - 24  HUM:      60.7  mm     G. Age:  35w 1d         79  %  Est. FW:    2871  gm      6 lb 5 oz     93  % ---------------------------------------------------------------------- OB History  Blood Type:   A+  Maternal Racial/Ethnic Group:   Black (non-Hispanic)  Gravidity:    1         Term:   0        Prem:   0        SAB:   0  TOP:          0       Ectopic:  0        Living: 0 ---------------------------------------------------------------------- Gestational Age  LMP:           34w 5d        Date:   06/25/23                  EDD:   03/31/24  U/S Today:     35w 6d  EDD:   03/23/24  Best:          34w 1d     Det. By:  U/S C R L  (08/31/23)    EDD:   04/04/24 ---------------------------------------------------------------------- Targeted Anatomy  Central Nervous System  Calvarium/Cranial V.:  Appears normal         Cereb./Vermis:          Previously seen  Cavum:                 Appears normal         Cisterna Magna:         Previously seen  Lateral Ventricles:    Previously seen        Midline Falx:           Previously seen  Choroid Plexus:        Previously seen  Spine  Cervical:              Previously seen        Sacral:                 Previously seen  Thoracic:              Previously seen        Shape/Curvature:        Previously seen  Lumbar:                Previously seen  Head/Neck  Lips:                  Previously seen        Profile:                Appears normal  Neck:                  Previously seen        Orbits/Eyes:            Previously seen  Nuchal Fold:           Previously seen        Mandible:               Previously seen  Nasal Bone:            Previously seen        Maxilla:                Previously seen  Thorax  4 Chamber View:        Previously seen        SVC:                    Previously seen  Cardiac Activity:      Observed               Interventr. Septum:     Previously seen  Cardiac Rhythm:        Normal                 Cardiac Axis:           Normal  Cardiac Situs:         Previously seen        Diaphragm:              Appears normal  Rt Outflow Tract:      Previously seen        3 Vessel View:  Previously seen  Lt Outflow Tract:      Previously seen        3 V Trachea View:       Previously seen  Aortic Arch:           Previously seen        IVC:                    Previously seen  Ductal Arch:           Not well visualized    Crossing:               Previously seen  Abdomen  Ventral Wall:          Previously seen        Lt Kidney:               Appears normal  Cord Insertion:        Previously seen        Rt Kidney:              Appears normal  Situs:                 Previously seen        Bladder:                Appears normal  Stomach:               Appears normal  Extremities  Lt Humerus:            Previously seen        Lt Femur:               Previously seen  Rt Humerus:            Previously seen        Rt Femur:               Previously seen  Lt Forearm:            Previously seen        Lt Lower Leg:           Previously seen  Rt Forearm:            Previously seen        Rt Lower Leg:           Previously seen  Lt Hand:               Visualized limited     Lt Foot:                Previously seen  Rt Hand:               Visualized limited     Rt Foot:                Previously seen  Other  Umbilical Cord:        Previously seen        Genitalia:              Female-nml  Comment:     Technically difficult due to fetal position. ---------------------------------------------------------------------- Cervix Uterus Adnexa  Cervix  Not visualized (advanced GA >24wks) ---------------------------------------------------------------------- Comments  This patient has been followed due to chronic hypertension  and polyhydramnios.  She denies any problems since her last  exam.  The overall EFW of 6 pounds 5 ounces measures at the 93rd  percentile for her gestational age.  Polyhydramnios with a  total AFI of 31.18 cm continues to be noted today.  Due to polyhydramnios, she will continue weekly NSTs in  your office for the next 3 weeks.  She will return to our office for another growth scan and BPP  in 4 weeks. ----------------------------------------------------------------------                   Sal Crass, MD Electronically Signed Final Report   02/23/2024 12:19 pm ----------------------------------------------------------------------    Assessment and Plan:  Pregnancy: G1P0000 at [redacted]w[redacted]d 1. Chronic hypertension affecting pregnancy (Primary) Per  pt, BP WNL, no meds Chart reviewed and discussed with on site midwife.   Pt is not a candidate for waterbirth at this time.  2. Supervision of other normal pregnancy, antepartum Continue routine prenatal care  3. Polyhydramnios affecting pregnancy in third trimester Last AFI was 31, weekly testing noted  4. Obesity in pregnancy, antepartum   5. Excessive fetal growth affecting pregnancy, antepartum, single or unspecified fetus Last EFW was 93% IOL scheduled at 39 weeks  6. [redacted] weeks gestation of pregnancy   Term labor symptoms and general obstetric precautions including but not limited to vaginal bleeding, contractions, leaking of fluid and fetal movement were reviewed in detail with the patient. I discussed the assessment and treatment plan with the patient. The patient was provided an opportunity to ask questions and all were answered. The patient agreed with the plan and demonstrated an understanding of the instructions. The patient was advised to call back or seek an in-person office evaluation/go to [facility] at [facility] for any urgent or concerning symptoms. Please refer to After Visit Summary for other counseling recommendations.   I provided 20 minutes of face-to-face time during this encounter.  No follow-ups on file.  Future Appointments  Date Time Provider Department Center  03/21/2024  1:45 PM [facility] PROVIDER 1 [facility]  03/21/2024  2:15 PM [facility] NST [facility]  03/21/2024  3:30 PM [facility] US5 [facility]  03/24/2024  7:00 AM [facility] SCHED ROOM [facility] None    Abigail Abler, MD [facility] [employer], [facility]

## 2024-03-19 ENCOUNTER — Encounter: Payer: Self-pay | Admitting: Obstetrics and Gynecology

## 2024-03-21 ENCOUNTER — Ambulatory Visit: Attending: Obstetrics and Gynecology | Admitting: *Deleted

## 2024-03-21 ENCOUNTER — Ambulatory Visit: Admitting: Obstetrics and Gynecology

## 2024-03-21 ENCOUNTER — Ambulatory Visit (HOSPITAL_BASED_OUTPATIENT_CLINIC_OR_DEPARTMENT_OTHER)

## 2024-03-21 VITALS — BP 137/71 | HR 97

## 2024-03-21 DIAGNOSIS — O3660X0 Maternal care for excessive fetal growth, unspecified trimester, not applicable or unspecified: Secondary | ICD-10-CM

## 2024-03-21 DIAGNOSIS — Z3A38 38 weeks gestation of pregnancy: Secondary | ICD-10-CM | POA: Diagnosis not present

## 2024-03-21 DIAGNOSIS — O10913 Unspecified pre-existing hypertension complicating pregnancy, third trimester: Secondary | ICD-10-CM | POA: Diagnosis not present

## 2024-03-21 DIAGNOSIS — O3663X0 Maternal care for excessive fetal growth, third trimester, not applicable or unspecified: Secondary | ICD-10-CM

## 2024-03-21 DIAGNOSIS — O99213 Obesity complicating pregnancy, third trimester: Secondary | ICD-10-CM | POA: Diagnosis not present

## 2024-03-21 DIAGNOSIS — O10013 Pre-existing essential hypertension complicating pregnancy, third trimester: Secondary | ICD-10-CM

## 2024-03-21 DIAGNOSIS — E669 Obesity, unspecified: Secondary | ICD-10-CM | POA: Diagnosis not present

## 2024-03-21 DIAGNOSIS — O403XX0 Polyhydramnios, third trimester, not applicable or unspecified: Secondary | ICD-10-CM

## 2024-03-21 DIAGNOSIS — O99210 Obesity complicating pregnancy, unspecified trimester: Secondary | ICD-10-CM

## 2024-03-21 DIAGNOSIS — Z3480 Encounter for supervision of other normal pregnancy, unspecified trimester: Secondary | ICD-10-CM

## 2024-03-21 DIAGNOSIS — O409XX0 Polyhydramnios, unspecified trimester, not applicable or unspecified: Secondary | ICD-10-CM

## 2024-03-21 NOTE — Procedures (Signed)
 Rachel Conley Dec 14, 1995 [redacted]w[redacted]d  Fetus A Non-Stress Test Interpretation for 03/21/24  Indication: Chronic Hypertenstion and Polyhydramnios  Fetal Heart Rate A Mode: External Baseline Rate (A): 130 bpm Variability: Moderate Accelerations: 15 x 15 Decelerations: None Multiple birth?: No  Uterine Activity Mode: Toco Contraction Frequency (min): 3-5 Contraction Duration (sec): 40-120 Contraction Quality: Mild Resting Tone Palpated: Relaxed Resting Time: Adequate  Interpretation (Fetal Testing) Nonstress Test Interpretation: Reactive Comments: Tracing reviewed by Dr. Arnie Bibber

## 2024-03-22 ENCOUNTER — Ambulatory Visit: Payer: Medicaid Other

## 2024-03-22 ENCOUNTER — Other Ambulatory Visit: Payer: Medicaid Other

## 2024-03-24 ENCOUNTER — Inpatient Hospital Stay (HOSPITAL_COMMUNITY)

## 2024-03-24 ENCOUNTER — Encounter (HOSPITAL_COMMUNITY): Payer: Self-pay | Admitting: Obstetrics and Gynecology

## 2024-03-24 ENCOUNTER — Inpatient Hospital Stay (HOSPITAL_COMMUNITY)
Admission: RE | Admit: 2024-03-24 | Discharge: 2024-03-28 | DRG: 787 | Disposition: A | Discharge status: Home / Self Care | Attending: Obstetrics and Gynecology | Admitting: Obstetrics and Gynecology

## 2024-03-24 ENCOUNTER — Other Ambulatory Visit: Payer: Self-pay

## 2024-03-24 DIAGNOSIS — O1002 Pre-existing essential hypertension complicating childbirth: Secondary | ICD-10-CM | POA: Diagnosis not present

## 2024-03-24 DIAGNOSIS — Z98891 History of uterine scar from previous surgery: Principal | ICD-10-CM

## 2024-03-24 DIAGNOSIS — O99824 Streptococcus B carrier state complicating childbirth: Secondary | ICD-10-CM | POA: Diagnosis present

## 2024-03-24 DIAGNOSIS — Z3490 Encounter for supervision of normal pregnancy, unspecified, unspecified trimester: Principal | ICD-10-CM | POA: Diagnosis present

## 2024-03-24 DIAGNOSIS — Z3A39 39 weeks gestation of pregnancy: Secondary | ICD-10-CM

## 2024-03-24 DIAGNOSIS — O1092 Unspecified pre-existing hypertension complicating childbirth: Secondary | ICD-10-CM | POA: Diagnosis present

## 2024-03-24 DIAGNOSIS — Z3480 Encounter for supervision of other normal pregnancy, unspecified trimester: Secondary | ICD-10-CM

## 2024-03-24 DIAGNOSIS — Z5941 Food insecurity: Secondary | ICD-10-CM | POA: Diagnosis not present

## 2024-03-24 DIAGNOSIS — O3660X0 Maternal care for excessive fetal growth, unspecified trimester, not applicable or unspecified: Secondary | ICD-10-CM | POA: Diagnosis present

## 2024-03-24 DIAGNOSIS — O3663X0 Maternal care for excessive fetal growth, third trimester, not applicable or unspecified: Secondary | ICD-10-CM | POA: Diagnosis present

## 2024-03-24 DIAGNOSIS — O99214 Obesity complicating childbirth: Secondary | ICD-10-CM | POA: Diagnosis present

## 2024-03-24 DIAGNOSIS — O99210 Obesity complicating pregnancy, unspecified trimester: Secondary | ICD-10-CM | POA: Diagnosis present

## 2024-03-24 DIAGNOSIS — O403XX0 Polyhydramnios, third trimester, not applicable or unspecified: Secondary | ICD-10-CM | POA: Diagnosis present

## 2024-03-24 DIAGNOSIS — O134 Gestational [pregnancy-induced] hypertension without significant proteinuria, complicating childbirth: Secondary | ICD-10-CM | POA: Diagnosis present

## 2024-03-24 DIAGNOSIS — D649 Anemia, unspecified: Secondary | ICD-10-CM | POA: Diagnosis present

## 2024-03-24 DIAGNOSIS — Z30017 Encounter for initial prescription of implantable subdermal contraceptive: Secondary | ICD-10-CM | POA: Diagnosis not present

## 2024-03-24 DIAGNOSIS — O10919 Unspecified pre-existing hypertension complicating pregnancy, unspecified trimester: Secondary | ICD-10-CM | POA: Diagnosis present

## 2024-03-24 DIAGNOSIS — O99820 Streptococcus B carrier state complicating pregnancy: Secondary | ICD-10-CM | POA: Diagnosis not present

## 2024-03-24 LAB — TYPE AND SCREEN
ABO/RH(D): A POS
Antibody Screen: NEGATIVE

## 2024-03-24 LAB — CBC
HCT: 29.5 % — ABNORMAL LOW (ref 36.0–46.0)
Hemoglobin: 9.8 g/dL — ABNORMAL LOW (ref 12.0–15.0)
MCH: 29.3 pg (ref 26.0–34.0)
MCHC: 33.2 g/dL (ref 30.0–36.0)
MCV: 88.3 fL (ref 80.0–100.0)
Platelets: 243 10*3/uL (ref 150–400)
RBC: 3.34 MIL/uL — ABNORMAL LOW (ref 3.87–5.11)
RDW: 14.3 % (ref 11.5–15.5)
WBC: 10.5 10*3/uL (ref 4.0–10.5)
nRBC: 0 % (ref 0.0–0.2)

## 2024-03-24 MED ORDER — OXYCODONE-ACETAMINOPHEN 5-325 MG PO TABS: 2.0000 | ORAL_TABLET | ORAL | Status: DC | PRN

## 2024-03-24 MED ORDER — LIDOCAINE HCL (PF) 1 % IJ SOLN: 30.0000 mL | INTRAMUSCULAR | Status: DC | PRN

## 2024-03-24 MED ORDER — SOD CITRATE-CITRIC ACID 500-334 MG/5ML PO SOLN
30.0000 mL | ORAL | Status: DC | PRN
  Filled 2024-03-24: qty 30

## 2024-03-24 MED ORDER — OXYTOCIN-SODIUM CHLORIDE 30-0.9 UT/500ML-% IV SOLN
2.5000 [IU]/h | INTRAVENOUS | Status: DC
  Filled 2024-03-24: qty 500

## 2024-03-24 MED ORDER — PENICILLIN G POT IN DEXTROSE 60000 UNIT/ML IV SOLN
3.0000 10*6.[IU] | INTRAVENOUS | Status: DC
  Administered 2024-03-24 – 2024-03-26 (×11): 3 10*6.[IU] via INTRAVENOUS
  Filled 2024-03-24 (×11): qty 50

## 2024-03-24 MED ORDER — SODIUM CHLORIDE 0.9 % IV SOLN
5.0000 10*6.[IU] | Freq: Once | INTRAVENOUS | Status: AC
  Administered 2024-03-24: 5 10*6.[IU] via INTRAVENOUS
  Filled 2024-03-24: qty 5

## 2024-03-24 MED ORDER — OXYTOCIN BOLUS FROM INFUSION: 333.0000 mL | Freq: Once | INTRAVENOUS | Status: DC

## 2024-03-24 MED ORDER — TERBUTALINE SULFATE 1 MG/ML IJ SOLN: 0.2500 mg | Freq: Once | INTRAMUSCULAR | Status: DC | PRN

## 2024-03-24 MED ORDER — LACTATED RINGERS IV SOLN: INTRAVENOUS | Status: AC

## 2024-03-24 MED ORDER — LACTATED RINGERS IV SOLN
500.0000 mL | INTRAVENOUS | Status: DC | PRN
  Administered 2024-03-25: 500 mL via INTRAVENOUS

## 2024-03-24 MED ORDER — FLUTICASONE PROPIONATE 50 MCG/ACT NA SUSP
2.0000 | Freq: Every day | NASAL | Status: DC
  Administered 2024-03-24: 2 via NASAL
  Filled 2024-03-24: qty 16

## 2024-03-24 MED ORDER — OXYTOCIN-SODIUM CHLORIDE 30-0.9 UT/500ML-% IV SOLN
1.0000 m[IU]/min | INTRAVENOUS | Status: DC
  Administered 2024-03-24: 2 m[IU]/min via INTRAVENOUS
  Administered 2024-03-25: 30 m[IU]/min via INTRAVENOUS
  Administered 2024-03-25: 2 m[IU]/min via INTRAVENOUS
  Administered 2024-03-26: 24 m[IU]/min via INTRAVENOUS
  Administered 2024-03-26: 34 m[IU]/min via INTRAVENOUS
  Filled 2024-03-24: qty 500

## 2024-03-24 MED ORDER — ACETAMINOPHEN 325 MG PO TABS: 650.0000 mg | ORAL_TABLET | ORAL | Status: DC | PRN

## 2024-03-24 MED ORDER — TRANEXAMIC ACID-NACL 1000-0.7 MG/100ML-% IV SOLN: 1000.0000 mg | INTRAVENOUS | Status: AC

## 2024-03-24 MED ORDER — ONDANSETRON HCL 4 MG/2ML IJ SOLN
4.0000 mg | Freq: Four times a day (QID) | INTRAMUSCULAR | Status: DC | PRN
  Administered 2024-03-25: 4 mg via INTRAVENOUS
  Filled 2024-03-24: qty 2

## 2024-03-24 MED ORDER — OXYCODONE-ACETAMINOPHEN 5-325 MG PO TABS: 1.0000 | ORAL_TABLET | ORAL | Status: DC | PRN

## 2024-03-24 MED ORDER — MISOPROSTOL 50MCG HALF TABLET
50.0000 ug | ORAL_TABLET | ORAL | Status: DC
  Administered 2024-03-24 (×2): 50 ug via ORAL
  Filled 2024-03-24 (×2): qty 1

## 2024-03-24 NOTE — H&P (Signed)
 OBSTETRIC ADMISSION HISTORY AND PHYSICAL  Rachel Conley is a 28 y.o. female G1P0000 with IUP at [redacted]w[redacted]d by LMP presenting for IOL CHTN and LGA with polyhydramnios. She reports +FMs, No LOF, no VB, no blurry vision, headaches or peripheral edema, and RUQ pain.  She plans on breast feeding. She is thinking about a Nexplanon  She received her prenatal care at  [facility]    Dating: By LMP --->  Estimated Date of Delivery: 03/31/24  Sono:   @[redacted]w[redacted]d , CWD, normal female anatomy, cephalic presentation, posterior placental lie, 3492 gm 7 lb 11 oz 73 % EFW, 84% AC   Prenatal History/Complications:  - CHTN no medications - LGA 84% AC  73% EFW; AFI 15.8, pelvis unproven. No diabetes. - Anemia, Hb 9.9, normocytic  Past Medical History: Past Medical History:  Diagnosis Date   Hypertension    Medical history non-contributory     Past Surgical History: Past Surgical History:  Procedure Laterality Date   NO PAST SURGERIES      Obstetrical History: OB History     Gravida  1   Para  0   Term  0   Preterm  0   AB  0   Living  0      SAB  0   IAB  0   Ectopic  0   Multiple  0   Live Births  0           Social History Social History   Socioeconomic History   Marital status: Single    Spouse name: Not on file   Number of children: Not on file   Years of education: Not on file   Highest education level: Not on file  Occupational History   Not on file  Tobacco Use   Smoking status: Never   Smokeless tobacco: Never  Vaping Use   Vaping status: Never Used  Substance and Sexual Activity   Alcohol use: No   Drug use: Not Currently    Types: Marijuana    Comment: monthly, last year 2024   Sexual activity: Yes    Birth control/protection: None  Other Topics Concern   Not on file  Social History Narrative   Not on file   Social Drivers of Health   Financial Resource Strain: Not on file  Food Insecurity: Food Insecurity Present (03/24/2024)   Hunger Vital Sign     Worried About Running Out of Food in the Last Year: Sometimes true    Ran Out of Food in the Last Year: Never true  Transportation Needs: No Transportation Needs (03/24/2024)   PRAPARE - Administrator, Civil Service (Medical): No    Lack of Transportation (Non-Medical): No  Physical Activity: Not on file  Stress: Not on file  Social Connections: Not on file    Family History: Family History  Problem Relation Age of Onset   Cancer Mother    Kidney disease Mother    Varicose Veins Mother    Asthma Neg Hx    Diabetes Neg Hx    Heart disease Neg Hx    Hypertension Neg Hx     Allergies: No Known Allergies  Medications Prior to Admission  Medication Sig Dispense Refill Last Dose/Taking   aspirin  EC 81 MG tablet Take 1 tablet (81 mg total) by mouth daily. Start taking when you are [redacted] weeks pregnant for rest of pregnancy for prevention of preeclampsia 300 tablet 2    Blood Pressure Monitoring (BLOOD PRESSURE KIT) DEVI  1 Device by Does not apply route once a week. (Patient not taking: Reported on 03/21/2024) 1 each 0    ferrous sulfate  325 (65 FE) MG tablet Take 1 tablet (325 mg total) by mouth every other day. 90 tablet 1    loratadine  (CLARITIN ) 10 MG tablet Take 1 tablet (10 mg total) by mouth daily. 30 tablet 11    pantoprazole  (PROTONIX ) 40 MG tablet Take 1 tablet (40 mg total) by mouth daily. 30 tablet 3    Prenatal Vit-Fe Fumarate-FA (PRENATAL VITAMINS PO) Take 1 tablet by mouth daily.        Review of Systems   All systems reviewed and negative except as stated in HPI  Blood pressure (!) 143/76, pulse (!) 108, height 5\' 3"  (1.6 m), weight 116.1 kg, last menstrual period 06/25/2023. General appearance: alert, cooperative, appears stated age, and no distress Lungs: clear to auscultation bilaterally Heart: regular rate and rhythm Abdomen: soft, non-tender; bowel sounds normal Pelvic: adequate, unproven Extremities: Homans sign is negative, no sign of DVT DTR's  WNL Presentation: cephalic Fetal monitoringBaseline: 135 bpm, Variability: Good {> 6 bpm), Accelerations: Reactive, and Decelerations: Absent Uterine activity Irritability Dilation: 1 Station: -3 Exam by:: Quincey Quesinberry  Dilation: 1 Station: -3 Presentation: Vertex Exam by:: Reyann Troop FB attempted with hand only but was not successful. Placed with speculum and ringed forceps. Inflated to 40cc  Prenatal labs: ABO, Rh: --/--/PENDING (04/20 1212) Antibody: PENDING (04/20 1212) Rubella: 1.45 (09/26 1523) RPR: Non Reactive (01/31 0932)  HBsAg: Negative (09/26 1523)  HIV: Non Reactive (01/31 0932)  GBS: Positive/-- (03/28 0953)    Lab Results  Component Value Date   GBS Positive (A) 03/01/2024   GTT normal Genetic screening  negative, low risk female Anatomy US  normal  Immunization History  Administered Date(s) Administered   Influenza, Seasonal, Injecte, Preservative Fre 01/05/2024   Tdap 01/05/2024    Prenatal Transfer Tool  Maternal Diabetes: No Genetic Screening: Normal Maternal Ultrasounds/Referrals: Normal Fetal Ultrasounds or other Referrals:  None Maternal Substance Abuse:  No Significant Maternal Medications:  None Significant Maternal Lab Results: Group B Strep positive Number of Prenatal Visits:greater than 3 verified prenatal visits Maternal Vaccinations:TDap, Flu, and Covid Other Comments:  None   Results for orders placed or performed during the hospital encounter of 03/24/24 (from the past 24 hours)  Type and screen   Collection Time: 03/24/24 12:12 PM  Result Value Ref Range   ABO/RH(D) PENDING    Antibody Screen PENDING    Sample Expiration      03/27/2024,2359 Performed at [facility], [address], [place], [place] [address]   CBC   Collection Time: 03/24/24 12:39 PM  Result Value Ref Range   WBC 10.5 4.0 - 10.5 K/uL   RBC 3.34 (L) 3.87 - 5.11 MIL/uL   Hemoglobin 9.8 (L) 12.0 - 15.0 g/dL   HCT 60.4 (L) 54.0 - 98.1 %   MCV 88.3 80.0 - 100.0  fL   MCH 29.3 26.0 - 34.0 pg   MCHC 33.2 30.0 - 36.0 g/dL   RDW 19.1 47.8 - 29.5 %   Platelets 243 150 - 400 K/uL   nRBC 0.0 0.0 - 0.2 %    Patient Active Problem List   Diagnosis Date Noted   Encounter for induction of labor 03/24/2024   LGA (large for gestational age) fetus affecting mother, antepartum 01/27/2024   Polyhydramnios affecting pregnancy in third trimester 01/27/2024   Anemia 01/08/2024   Chronic hypertension affecting pregnancy 11/08/2023  Obesity in pregnancy, antepartum 10/11/2023   Supervision of other normal pregnancy, antepartum 08/31/2023    Assessment/Plan:  Edee Nifong is a 28 y.o. G1P0000 at [redacted]w[redacted]d here for IOL for CHTN  #Labor: Placed FB (40cc) with speculum and plan for oral Cytotec . Will assess q4-6 hrs to determine next steps.  #Pain: Per pt request #FWB: Cat I #GBS status:  Positive; PCN #Feeding: Breastmilk  #Reproductive Life planning: Extensive discussion about options. Patient want at least several years between pregnancies and has tried pills and patch. She was nervous with the pill because she would forget to take it. She is also concerned about weight again.  After review of options with efficacy and taking memory out of the picture as priorities the patient thinks she might want a Nexplanon  #Circ:  Initially desired, consented with FOB Arnetta Lank present. They are now thinking they DO NOT want a circumcision. I placed the consent in the infant's chart if needed.   #Anemia Hb 9.9 - TXA at delivery - Iron supplementation post partum  Abner Ables, MD  03/24/2024, 1:41 PM

## 2024-03-24 NOTE — Progress Notes (Signed)
 Labor Progress Note Rachel Conley is a 28 y.o. G1P0000 at [redacted]w[redacted]d presented for IOL for CHTN with LGA.   S:  Coping well   O:  BP (!) 141/66 (BP Location: Left Arm)   Pulse 100   Temp 98.6 F (37 C) (Oral)   Resp 18   Ht 5\' 3"  (1.6 m)   Wt 116.1 kg   LMP 06/25/2023   SpO2 98%   BMI 45.33 kg/m   EFM: baseline 140 bpm/ moderate variability/ 15x15 accels/ variable decels  Toco/IUPC: irregular SVE: Dilation: 1.5 Effacement (%): Thick Cervical Position: Middle Station: Ballotable Presentation: Vertex Exam by:: Blue, RN Pitocin : 0 mu/min  A/P: 28 y.o. G1P0000 [redacted]w[redacted]d IOL for CHTN with LGA 1. Labor: IOL in latent phase of labor s/p FB and cytotec  x2. RBA of another FB discussed, with patient amenable to attempting again PRN in a few hours. RBA of AROM vs. Pitocin  additionally d/w patient, patient amenable to pitocin .  2. FWB: Cat 2, reassuring 3. Pain: Coping well, epidural on request 4. GBS positive with adequate treatment   Anticipate NVSB.  Salomon Cree, CNM 9:17 PM

## 2024-03-25 ENCOUNTER — Inpatient Hospital Stay (HOSPITAL_COMMUNITY): Admitting: Anesthesiology

## 2024-03-25 DIAGNOSIS — Z3A39 39 weeks gestation of pregnancy: Secondary | ICD-10-CM | POA: Diagnosis not present

## 2024-03-25 LAB — CBC
HCT: 29.4 % — ABNORMAL LOW (ref 36.0–46.0)
Hemoglobin: 9.6 g/dL — ABNORMAL LOW (ref 12.0–15.0)
MCH: 28.9 pg (ref 26.0–34.0)
MCHC: 32.7 g/dL (ref 30.0–36.0)
MCV: 88.6 fL (ref 80.0–100.0)
Platelets: 206 10*3/uL (ref 150–400)
RBC: 3.32 MIL/uL — ABNORMAL LOW (ref 3.87–5.11)
RDW: 14.4 % (ref 11.5–15.5)
WBC: 13.2 10*3/uL — ABNORMAL HIGH (ref 4.0–10.5)
nRBC: 0 % (ref 0.0–0.2)

## 2024-03-25 LAB — RPR: RPR Ser Ql: NONREACTIVE

## 2024-03-25 MED ORDER — EPHEDRINE 5 MG/ML INJ: 10.0000 mg | INTRAVENOUS | Status: DC | PRN

## 2024-03-25 MED ORDER — PHENYLEPHRINE 80 MCG/ML (10ML) SYRINGE FOR IV PUSH (FOR BLOOD PRESSURE SUPPORT): 80.0000 ug | PREFILLED_SYRINGE | INTRAVENOUS | Status: DC | PRN

## 2024-03-25 MED ORDER — PANTOPRAZOLE SODIUM 20 MG PO TBEC
20.0000 mg | DELAYED_RELEASE_TABLET | Freq: Every day | ORAL | Status: DC
  Administered 2024-03-25 – 2024-03-26 (×2): 20 mg via ORAL
  Filled 2024-03-25 (×3): qty 1

## 2024-03-25 MED ORDER — FENTANYL-BUPIVACAINE-NACL 0.5-0.125-0.9 MG/250ML-% EP SOLN
12.0000 mL/h | EPIDURAL | Status: DC | PRN
  Administered 2024-03-25 – 2024-03-26 (×2): 12 mL/h via EPIDURAL
  Filled 2024-03-25 (×2): qty 250

## 2024-03-25 MED ORDER — FENTANYL CITRATE (PF) 100 MCG/2ML IJ SOLN
100.0000 ug | INTRAMUSCULAR | Status: DC | PRN
  Administered 2024-03-25 (×2): 100 ug via INTRAVENOUS
  Filled 2024-03-25: qty 2

## 2024-03-25 MED ORDER — FENTANYL CITRATE (PF) 100 MCG/2ML IJ SOLN
INTRAMUSCULAR | Status: AC
  Filled 2024-03-25: qty 2

## 2024-03-25 MED ORDER — LIDOCAINE HCL (PF) 1 % IJ SOLN
INTRAMUSCULAR | Status: DC | PRN
  Administered 2024-03-25: 5 mL via EPIDURAL

## 2024-03-25 MED ORDER — DIPHENHYDRAMINE HCL 50 MG/ML IJ SOLN: 12.5000 mg | INTRAMUSCULAR | Status: DC | PRN

## 2024-03-25 MED ORDER — LACTATED RINGERS IV SOLN: 500.0000 mL | Freq: Once | INTRAVENOUS | Status: DC

## 2024-03-25 MED ORDER — LACTATED RINGERS IV SOLN: INTRAVENOUS | Status: DC

## 2024-03-25 NOTE — Progress Notes (Signed)
 LABOR PROGRESS NOTE  Patient Name: Rachel Conley, female   DOB: [birth year], 28 y.o.  MRN: [id number]  Patient starting to have shivers and nausea. Amenable to check. Cervix 4.5/70/-1. Continue pit and titrate according to adequate MVUs. Cat I.  #cHTN: Mostly normotensive. Few mild range.  Maud Sorenson, MD

## 2024-03-25 NOTE — Anesthesia Procedure Notes (Signed)
 Epidural Patient location during procedure: OB Start time: 03/25/2024 6:40 AM End time: 03/25/2024 6:55 AM  Staffing Anesthesiologist: Rosalita Combe, MD Performed: anesthesiologist   Preanesthetic Checklist Completed: patient identified, IV checked, site marked, risks and benefits discussed, surgical consent, monitors and equipment checked, pre-op evaluation and timeout performed  Epidural Patient position: sitting Prep: DuraPrep and site prepped and draped Patient monitoring: continuous pulse ox and blood pressure Approach: midline Location: L3-L4 Injection technique: LOR air  Needle:  Needle type: Tuohy  Needle gauge: 17 G Needle length: 9 cm and 9 Needle insertion depth: 8 cm Catheter type: closed end flexible Catheter size: 19 Gauge Catheter at skin depth: 15 cm Test dose: negative  Assessment Events: blood not aspirated, no cerebrospinal fluid, injection not painful, no injection resistance, no paresthesia and negative IV test  Additional Notes Patient identified. Risks/Benefits/Options discussed with patient including but not limited to bleeding, infection, nerve damage, paralysis, failed block, incomplete pain control, headache, blood pressure changes, nausea, vomiting, reactions to medication both or allergic, itching and postpartum back pain. Confirmed with bedside nurse the patient's most recent platelet count. Confirmed with patient that they are not currently taking any anticoagulation, have any bleeding history or any family history of bleeding disorders. Patient expressed understanding and wished to proceed. All questions were answered. Sterile technique was used throughout the entire procedure. Please see nursing notes for vital signs. Test dose was given through epidural needle and negative prior to continuing to dose epidural or start infusion. Warning signs of high block given to the patient including shortness of breath, tingling/numbness in hands, complete  motor block, or any concerning symptoms with instructions to call for help. Patient was given instructions on fall risk and not to get out of bed. All questions and concerns addressed with instructions to call with any issues. 2 Attempt (S) . Patient tolerated procedure well.

## 2024-03-25 NOTE — Progress Notes (Signed)
 Patient ID: Rachel Conley, female   DOB: 09-Oct-1996, 28 y.o.   MRN: [id number] Doing well Comfortable with epidural   Vitals:   03/25/24 1930 03/25/24 2000 03/25/24 2037 03/25/24 2100  BP: (!) 146/81 132/73  131/65  Pulse: 95 97  72  Resp:  17 18   Temp: 98.3 F (36.8 C)     TempSrc: Oral     SpO2:  100% 100% 100%  Weight:      Height:       FHR stable and reactive UCs adequate per IUPC for the most part  Cervix exam deferred

## 2024-03-25 NOTE — Anesthesia Preprocedure Evaluation (Signed)
 Anesthesia Evaluation  Patient identified by MRN, date of birth, ID band Patient awake    Reviewed: Allergy & Precautions, NPO status , Patient's Chart, lab work & pertinent test results  Airway Mallampati: III  TM Distance: >3 FB Neck ROM: Full    Dental no notable dental hx. (+) Teeth Intact, Dental Advisory Given   Pulmonary neg pulmonary ROS   Pulmonary exam normal breath sounds clear to auscultation       Cardiovascular hypertension (cHtn), Normal cardiovascular exam Rhythm:Regular Rate:Normal     Neuro/Psych negative neurological ROS  negative psych ROS   GI/Hepatic negative GI ROS, Neg liver ROS,,,  Endo/Other    Renal/GU negative Renal ROS     Musculoskeletal   Abdominal   Peds  Hematology  (+) Blood dyscrasia, anemia Lab Results      Component                Value               Date                      WBC                      13.2 (H)            03/25/2024                HGB                      9.6 (L)             03/25/2024                HCT                      29.4 (L)            03/25/2024                MCV                      88.6                03/25/2024                PLT                      206                 03/25/2024              Anesthesia Other Findings   Reproductive/Obstetrics (+) Pregnancy                             Anesthesia Physical Anesthesia Plan  ASA: 3  Anesthesia Plan: Epidural   Post-op Pain Management: Tylenol  PO (pre-op)* and Epidural*   Induction:   PONV Risk Score and Plan: 2 and Treatment may vary due to age or medical condition, Ondansetron  and Scopolamine  patch - Pre-op  Airway Management Planned: Natural Airway  Additional Equipment: None  Intra-op Plan:   Post-operative Plan:   Informed Consent: I have reviewed the patients History and Physical, chart, labs and discussed the procedure including the risks, benefits and  alternatives for the proposed anesthesia with the patient or authorized representative who has indicated his/her understanding and acceptance.  Plan Discussed with: Anesthesiologist  Anesthesia Plan Comments: (39.1 wk Primagravida w cHtn, BMI 45.3 and fetal Macrosomina for LEA  Update: Update: Epidural working well for labor. Plan to use for C-section with backup plan of GETA. Patient expressed understanding. )       Anesthesia Quick Evaluation

## 2024-03-25 NOTE — Progress Notes (Signed)
 Labor Progress Note Debra Colon is a 28 y.o. G1P0000 at [redacted]w[redacted]d presented for IOL for CHTN with LGA  S:  Coping well.   O:  BP (!) 143/62 (BP Location: Left Arm)   Pulse 97   Temp 99 F (37.2 C) (Oral)   Resp 17   Ht 5\' 3"  (1.6 m)   Wt 116.1 kg   LMP 06/25/2023   SpO2 98%   BMI 45.33 kg/m   EFM: baseline 125 bpm/ moderate variability/ 15x15 accels/ absent decels  Toco/IUPC: 2-5 SVE: Dilation: 3 Effacement (%): 70 Cervical Position: Middle Station: -3 Presentation: Vertex Exam by:: Warren-Hill, CNM Pitocin : 2 mu/min  A/P: 28 y.o. G1P0000 [redacted]w[redacted]d IOL for CHTN with LGA 1. Labor: IOL in latent phase of labor, with good progress. S/p FB x2, with 2nd FB now expelled. RBA of AROM discussed with patient, with patient agreeable to intervention once epidural placed.  2. FWB: Cat 1  3. Pain: Coping well, requesting epidural.  4. GBS pos   Anticipate NVSB.  Salomon Cree, CNM 6:05 AM

## 2024-03-25 NOTE — Progress Notes (Signed)
 28 yo g1 @ 39+1 here for iol for chtn, s/p ripening, checked now and remains 3 cm dilated, on 22 of pitocin . IUPC placed without complication, if adequate will plan on additional time of pitocin , will otherwise up-titrate. Patient aware failed inductions can happen.

## 2024-03-25 NOTE — Progress Notes (Signed)
 Labor Progress Note Rachel Conley is a 28 y.o. G1P0000 at [redacted]w[redacted]d presented for IOL for CHTN with LGA  S:  Coping well.   O:  BP (!) 143/62 (BP Location: Left Arm)   Pulse 97   Temp 99 F (37.2 C) (Oral)   Resp 17   Ht 5\' 3"  (1.6 m)   Wt 116.1 kg   LMP 06/25/2023   SpO2 98%   BMI 45.33 kg/m   EFM: baseline 135 bpm/ moderate variability/ 15x15 accels/ absent decels  Toco/IUPC: 2-4 SVE: Dilation: 2 Effacement (%): 50 Cervical Position: Middle Station: -3 Presentation: Vertex Exam by:: Warren-Hill, CNM Pitocin : 0 mu/min  A/P: 28 y.o. G1P0000 [redacted]w[redacted]d IOL for CHTN with LGA  1. Labor: IOL in latent phase of labor s/p FB expulsion and cytotec , albeit with minimal change. Patient agreeable to repeating FB placement with a larger volume of fluid and low-dose pitocin , holding at 6.  On cervical exam internal cervical os with a tight band. Patient tolerated FB placement well.  2. FWB: Cat 1 3. Pain: Coping well 4. GBS pos with adequate treatment    Anticipate NVSB.  Salomon Cree, CNM 2:58 AM

## 2024-03-25 NOTE — Progress Notes (Signed)
 Labor Progress Note Rachel Conley is a 28 y.o. G1P0000 at [redacted]w[redacted]d presented for IOL d/t CHTN and LGA S: Patient is resting comfortably.  O:  BP 135/65   Pulse 91   Temp 98.3 F (36.8 C) (Axillary)   Resp 16   Ht 5\' 3"  (1.6 m)   Wt 116.1 kg   LMP 06/25/2023   SpO2 100%   BMI 45.33 kg/m  EFM: 150/moderate/reactive accelerations  CVE: Dilation: 3 Effacement (%): 70 Cervical Position: Middle Station: -3 Presentation: Vertex Exam by:: Warren-Hill, CNM   A&P: 27 y.o. G1P0000 [redacted]w[redacted]d for IOL #Labor: Progressing well. AROM @ 0740, small clear fluids, tolerated well #Pain: epidural #FWB: Cat I #GBS positive   [name], DO [facility] [employer], [facility] 7:39 AM

## 2024-03-26 ENCOUNTER — Encounter (HOSPITAL_COMMUNITY): Payer: Self-pay | Admitting: Obstetrics and Gynecology

## 2024-03-26 ENCOUNTER — Other Ambulatory Visit: Payer: Self-pay

## 2024-03-26 ENCOUNTER — Encounter (HOSPITAL_COMMUNITY)
Admission: RE | Disposition: A | Discharge status: Home / Self Care | Payer: Self-pay | Source: Home / Self Care | Attending: Obstetrics and Gynecology

## 2024-03-26 DIAGNOSIS — O99820 Streptococcus B carrier state complicating pregnancy: Secondary | ICD-10-CM

## 2024-03-26 DIAGNOSIS — O3663X0 Maternal care for excessive fetal growth, third trimester, not applicable or unspecified: Secondary | ICD-10-CM | POA: Diagnosis not present

## 2024-03-26 DIAGNOSIS — Z98891 History of uterine scar from previous surgery: Principal | ICD-10-CM

## 2024-03-26 DIAGNOSIS — O1002 Pre-existing essential hypertension complicating childbirth: Secondary | ICD-10-CM | POA: Diagnosis not present

## 2024-03-26 DIAGNOSIS — Z3A39 39 weeks gestation of pregnancy: Secondary | ICD-10-CM | POA: Diagnosis not present

## 2024-03-26 DIAGNOSIS — O403XX0 Polyhydramnios, third trimester, not applicable or unspecified: Secondary | ICD-10-CM | POA: Diagnosis not present

## 2024-03-26 DIAGNOSIS — O99214 Obesity complicating childbirth: Secondary | ICD-10-CM | POA: Diagnosis not present

## 2024-03-26 SURGERY — Surgical Case
Anesthesia: Epidural

## 2024-03-26 MED ORDER — SCOPOLAMINE 1 MG/3DAYS TD PT72
1.0000 | MEDICATED_PATCH | Freq: Once | TRANSDERMAL | Status: DC
  Administered 2024-03-26: 1.5 mg via TRANSDERMAL
  Filled 2024-03-26: qty 1

## 2024-03-26 MED ORDER — SIMETHICONE 80 MG PO CHEW
80.0000 mg | CHEWABLE_TABLET | Freq: Three times a day (TID) | ORAL | Status: DC
  Administered 2024-03-27 – 2024-03-28 (×5): 80 mg via ORAL
  Filled 2024-03-26 (×5): qty 1

## 2024-03-26 MED ORDER — PHENYLEPHRINE HCL (PRESSORS) 10 MG/ML IV SOLN
INTRAVENOUS | Status: DC | PRN
Start: 2024-03-26 — End: 2024-03-26
  Administered 2024-03-26 (×2): 160 ug via INTRAVENOUS

## 2024-03-26 MED ORDER — ACETAMINOPHEN 500 MG PO TABS
1000.0000 mg | ORAL_TABLET | Freq: Four times a day (QID) | ORAL | Status: DC
  Administered 2024-03-26 – 2024-03-28 (×7): 1000 mg via ORAL
  Filled 2024-03-26 (×7): qty 2

## 2024-03-26 MED ORDER — NALOXONE HCL 4 MG/10ML IJ SOLN: 1.0000 ug/kg/h | INTRAVENOUS | Status: DC | PRN

## 2024-03-26 MED ORDER — OXYCODONE HCL 5 MG PO TABS: 5.0000 mg | ORAL_TABLET | ORAL | Status: DC | PRN

## 2024-03-26 MED ORDER — ACETAMINOPHEN 10 MG/ML IV SOLN
INTRAVENOUS | Status: DC | PRN
  Administered 2024-03-26: 1000 mg via INTRAVENOUS

## 2024-03-26 MED ORDER — ACETAMINOPHEN 10 MG/ML IV SOLN
INTRAVENOUS | Status: AC
  Filled 2024-03-26: qty 300

## 2024-03-26 MED ORDER — MEASLES, MUMPS & RUBELLA VAC IJ SOLR: 0.5000 mL | Freq: Once | INTRAMUSCULAR | Status: DC

## 2024-03-26 MED ORDER — DIPHENHYDRAMINE HCL 25 MG PO CAPS: 25.0000 mg | ORAL_CAPSULE | ORAL | Status: DC | PRN

## 2024-03-26 MED ORDER — IBUPROFEN 600 MG PO TABS
600.0000 mg | ORAL_TABLET | Freq: Four times a day (QID) | ORAL | Status: DC
  Administered 2024-03-26 – 2024-03-28 (×7): 600 mg via ORAL
  Filled 2024-03-26 (×7): qty 1

## 2024-03-26 MED ORDER — KETOROLAC TROMETHAMINE 30 MG/ML IJ SOLN: 30.0000 mg | Freq: Four times a day (QID) | INTRAMUSCULAR | Status: AC | PRN

## 2024-03-26 MED ORDER — PRENATAL MULTIVITAMIN CH
1.0000 | ORAL_TABLET | Freq: Every day | ORAL | Status: DC
  Administered 2024-03-27 – 2024-03-28 (×2): 1 via ORAL
  Filled 2024-03-26 (×2): qty 1

## 2024-03-26 MED ORDER — LACTATED RINGERS IV BOLUS
500.0000 mL | Freq: Once | INTRAVENOUS | Status: AC
  Administered 2024-03-26: 500 mL via INTRAVENOUS

## 2024-03-26 MED ORDER — OXYTOCIN-SODIUM CHLORIDE 30-0.9 UT/500ML-% IV SOLN
INTRAVENOUS | Status: AC
Start: 2024-03-26 — End: ?
  Filled 2024-03-26: qty 500

## 2024-03-26 MED ORDER — ONDANSETRON HCL 4 MG/2ML IJ SOLN
INTRAMUSCULAR | Status: AC
  Filled 2024-03-26: qty 2

## 2024-03-26 MED ORDER — DIPHENHYDRAMINE HCL 50 MG/ML IJ SOLN: 12.5000 mg | INTRAMUSCULAR | Status: DC | PRN

## 2024-03-26 MED ORDER — OXYTOCIN-SODIUM CHLORIDE 30-0.9 UT/500ML-% IV SOLN
2.5000 [IU]/h | INTRAVENOUS | Status: AC
  Filled 2024-03-26: qty 500

## 2024-03-26 MED ORDER — WITCH HAZEL-GLYCERIN EX PADS: 1.0000 | MEDICATED_PAD | CUTANEOUS | Status: DC | PRN

## 2024-03-26 MED ORDER — PHENYLEPHRINE 80 MCG/ML (10ML) SYRINGE FOR IV PUSH (FOR BLOOD PRESSURE SUPPORT)
PREFILLED_SYRINGE | INTRAVENOUS | Status: AC
  Filled 2024-03-26: qty 10

## 2024-03-26 MED ORDER — FENTANYL CITRATE (PF) 100 MCG/2ML IJ SOLN
INTRAMUSCULAR | Status: AC
  Filled 2024-03-26: qty 2

## 2024-03-26 MED ORDER — FENTANYL CITRATE (PF) 100 MCG/2ML IJ SOLN
INTRAMUSCULAR | Status: DC | PRN
  Administered 2024-03-26: 100 ug via EPIDURAL

## 2024-03-26 MED ORDER — ONDANSETRON HCL 4 MG/2ML IJ SOLN: 4.0000 mg | Freq: Three times a day (TID) | INTRAMUSCULAR | Status: DC | PRN

## 2024-03-26 MED ORDER — NALOXONE HCL 0.4 MG/ML IJ SOLN: 0.4000 mg | INTRAMUSCULAR | Status: DC | PRN

## 2024-03-26 MED ORDER — DIBUCAINE (PERIANAL) 1 % EX OINT: 1.0000 | TOPICAL_OINTMENT | CUTANEOUS | Status: DC | PRN

## 2024-03-26 MED ORDER — MENTHOL 3 MG MT LOZG: 1.0000 | LOZENGE | OROMUCOSAL | Status: DC | PRN

## 2024-03-26 MED ORDER — DEXAMETHASONE SODIUM PHOSPHATE 4 MG/ML IJ SOLN
INTRAMUSCULAR | Status: AC
  Filled 2024-03-26: qty 1

## 2024-03-26 MED ORDER — DIPHENHYDRAMINE HCL 25 MG PO CAPS: 25.0000 mg | ORAL_CAPSULE | Freq: Four times a day (QID) | ORAL | Status: DC | PRN

## 2024-03-26 MED ORDER — METOCLOPRAMIDE HCL 5 MG/ML IJ SOLN
INTRAMUSCULAR | Status: DC | PRN
  Administered 2024-03-26: 10 mg via INTRAVENOUS

## 2024-03-26 MED ORDER — ENOXAPARIN SODIUM 60 MG/0.6ML IJ SOSY
60.0000 mg | PREFILLED_SYRINGE | INTRAMUSCULAR | Status: DC
  Administered 2024-03-27 – 2024-03-28 (×2): 60 mg via SUBCUTANEOUS
  Filled 2024-03-26 (×2): qty 0.6

## 2024-03-26 MED ORDER — CEFAZOLIN SODIUM-DEXTROSE 2-3 GM-%(50ML) IV SOLR
INTRAVENOUS | Status: DC | PRN
  Administered 2024-03-26: 2 g via INTRAVENOUS

## 2024-03-26 MED ORDER — METOCLOPRAMIDE HCL 5 MG/ML IJ SOLN
INTRAMUSCULAR | Status: AC
  Filled 2024-03-26: qty 2

## 2024-03-26 MED ORDER — TRANEXAMIC ACID-NACL 1000-0.7 MG/100ML-% IV SOLN
INTRAVENOUS | Status: DC | PRN
  Administered 2024-03-26: 1000 mg via INTRAVENOUS

## 2024-03-26 MED ORDER — SIMETHICONE 80 MG PO CHEW: 80.0000 mg | CHEWABLE_TABLET | ORAL | Status: DC | PRN

## 2024-03-26 MED ORDER — DEXAMETHASONE SODIUM PHOSPHATE 4 MG/ML IJ SOLN
INTRAMUSCULAR | Status: DC | PRN
  Administered 2024-03-26: 10 mg via INTRAVENOUS

## 2024-03-26 MED ORDER — COCONUT OIL OIL: 1.0000 | TOPICAL_OIL | Status: DC | PRN

## 2024-03-26 MED ORDER — MORPHINE SULFATE (PF) 0.5 MG/ML IJ SOLN
INTRAMUSCULAR | Status: DC | PRN
  Administered 2024-03-26: 3 mg via EPIDURAL

## 2024-03-26 MED ORDER — PROPOFOL 10 MG/ML IV BOLUS
INTRAVENOUS | Status: AC
  Filled 2024-03-26: qty 20

## 2024-03-26 MED ORDER — LACTATED RINGERS IV SOLN: INTRAVENOUS | Status: DC

## 2024-03-26 MED ORDER — MORPHINE SULFATE (PF) 0.5 MG/ML IJ SOLN
INTRAMUSCULAR | Status: AC
  Filled 2024-03-26: qty 10

## 2024-03-26 MED ORDER — ZOLPIDEM TARTRATE 5 MG PO TABS: 5.0000 mg | ORAL_TABLET | Freq: Every evening | ORAL | Status: DC | PRN

## 2024-03-26 MED ORDER — SODIUM CHLORIDE 0.9% FLUSH: 3.0000 mL | INTRAVENOUS | Status: DC | PRN

## 2024-03-26 MED ORDER — OXYTOCIN-SODIUM CHLORIDE 30-0.9 UT/500ML-% IV SOLN
INTRAVENOUS | Status: DC | PRN
  Administered 2024-03-26: 300 mL via INTRAVENOUS

## 2024-03-26 MED ORDER — STERILE WATER FOR IRRIGATION IR SOLN
Status: DC | PRN
  Administered 2024-03-26: 1

## 2024-03-26 MED ORDER — SODIUM BICARBONATE 8.4 % IV SOLN
INTRAVENOUS | Status: DC | PRN
  Administered 2024-03-26: 5 mL via EPIDURAL
  Administered 2024-03-26: 7 mL via EPIDURAL
  Administered 2024-03-26: 3 mL via EPIDURAL

## 2024-03-26 MED ORDER — SENNOSIDES-DOCUSATE SODIUM 8.6-50 MG PO TABS
2.0000 | ORAL_TABLET | ORAL | Status: DC
  Administered 2024-03-27 – 2024-03-28 (×2): 2 via ORAL
  Filled 2024-03-26 (×2): qty 2

## 2024-03-26 MED ORDER — PROPOFOL 10 MG/ML IV BOLUS
INTRAVENOUS | Status: DC | PRN
Start: 2024-03-26 — End: 2024-03-26
  Administered 2024-03-26: 20 mg via INTRAVENOUS

## 2024-03-26 MED ORDER — ONDANSETRON HCL 4 MG/2ML IJ SOLN
INTRAMUSCULAR | Status: DC | PRN
  Administered 2024-03-26: 4 mg via INTRAVENOUS

## 2024-03-26 SURGICAL SUPPLY — 27 items
BENZOIN TINCTURE PRP APPL 2/3 (GAUZE/BANDAGES/DRESSINGS) ×1 IMPLANT
CHLORAPREP W/TINT 26 (MISCELLANEOUS) ×2 IMPLANT
CLAMP UMBILICAL CORD (MISCELLANEOUS) ×1 IMPLANT
CLOTH BEACON ORANGE TIMEOUT ST (SAFETY) ×1 IMPLANT
DERMABOND ADVANCED .7 DNX12 (GAUZE/BANDAGES/DRESSINGS) IMPLANT
DRSG OPSITE POSTOP 4X10 (GAUZE/BANDAGES/DRESSINGS) ×1 IMPLANT
ELECTRODE REM PT RTRN 9FT ADLT (ELECTROSURGICAL) ×1 IMPLANT
EXTRACTOR VACUUM M CUP 4 TUBE (SUCTIONS) IMPLANT
GLOVE BIOGEL PI IND STRL 7.0 (GLOVE) ×2 IMPLANT
GLOVE BIOGEL PI IND STRL 7.5 (GLOVE) ×2 IMPLANT
GLOVE ECLIPSE 7.5 STRL STRAW (GLOVE) ×1 IMPLANT
GOWN STRL REUS W/TWL LRG LVL3 (GOWN DISPOSABLE) ×3 IMPLANT
KIT ABG SYR 3ML LUER SLIP (SYRINGE) IMPLANT
MAT PREVALON FULL STRYKER (MISCELLANEOUS) IMPLANT
NDL HYPO 25X5/8 SAFETYGLIDE (NEEDLE) IMPLANT
NEEDLE HYPO 25X5/8 SAFETYGLIDE (NEEDLE) IMPLANT
NS IRRIG 1000ML POUR BTL (IV SOLUTION) ×1 IMPLANT
PACK C SECTION WH (CUSTOM PROCEDURE TRAY) ×1 IMPLANT
PAD OB MATERNITY 4.3X12.25 (PERSONAL CARE ITEMS) ×1 IMPLANT
RTRCTR C-SECT PINK 25CM LRG (MISCELLANEOUS) ×1 IMPLANT
STRIP CLOSURE SKIN 1/2X4 (GAUZE/BANDAGES/DRESSINGS) ×1 IMPLANT
SUT VIC AB 0 CT1 36 (SUTURE) ×3 IMPLANT
SUT VIC AB 2-0 CT1 TAPERPNT 27 (SUTURE) ×1 IMPLANT
SUT VIC AB 4-0 KS 27 (SUTURE) ×1 IMPLANT
TOWEL OR 17X24 6PK STRL BLUE (TOWEL DISPOSABLE) ×1 IMPLANT
TRAY FOLEY W/BAG SLVR 14FR LF (SET/KITS/TRAYS/PACK) ×1 IMPLANT
WATER STERILE IRR 1000ML POUR (IV SOLUTION) ×1 IMPLANT

## 2024-03-26 NOTE — Lactation Note (Signed)
 This note was copied from a baby's chart. Lactation Consultation Note  Patient Name: Rachel Conley [id number] Date: 03/26/2024 Age:27 hours Reason for consult: Initial assessment;Primapara;1st time breastfeeding;Term  P1- MOB reports that infant latched immediately after birth, but quickly grew tired after a few minutes. LC reviewed the first 24 hr birthday nap and how that MOB could stimulate infant when nursing. Due to infant recently eating and MOB having 4 visitors, we did not attempt to latch infant. MOB agrees to call for a feeding so LC could assess a latch. MOB reports that her feeding plan is to breastfeeding exclusively for 6 months, then transition to formula when she starts food. LC reviewed day 2 cluster feeding, feeding infant on cue 8-12x in 24 hrs, not allowing infant to go over 3 hrs without a feeding, CDC milk storage guidelines, LC services handout and engorgement/breast care. LC encouraged MOB to call for further assistance as needed.  Maternal Data Has patient been taught Hand Expression?: Yes Does the patient have breastfeeding experience prior to this delivery?: No  Feeding Mother's Current Feeding Choice: Breast Milk  Lactation Tools Discussed/Used Pump Education: Milk Storage  Interventions Interventions: Breast feeding basics reviewed;Education;LC Services brochure  Discharge Discharge Education: Engorgement and breast care;Warning signs for feeding baby Pump: DEBP;Hands Free;Personal  Consult Status Consult Status: Follow-up Date: 03/27/24 Follow-up type: In-patient    Vernette Goo BS, IBCLC 03/26/2024, 7:25 PM

## 2024-03-26 NOTE — Op Note (Signed)
 Cesarean Section Operative Report  [name]  03/24/2024 - 03/26/2024  Indications: failed induction  Pre-operative Diagnosis: Primary cesarean section for failed induction.   Post-operative Diagnosis: Same   Surgeon: Surgeons and Role:    * Markevious Ehmke, Haynes Lips, MD - Primary    * Maud Sorenson, MD - Assisting   Attending Attestation: I was present and scrubbed for the entire procedure.   An experienced assistant was required given the standard of surgical care given the complexity of the case.  This assistant was needed for exposure, dissection, suctioning, retraction, instrument exchange, assisting with delivery with administration of fundal pressure, and for overall help during the procedure.  Anesthesia: epidural    Quantified Blood Loss: 305 ml  Total IV Fluids: 2000 ml LR  Urine Output:: 200 ml dark yellow urine  Specimens: none  Findings: Viable female infant in cephalic presentation; Apgars pending; weight pending; arterial cord pH not obtained;  clear amniotic fluid; intact placenta with three vessel cord; normal uterus, fallopian tubes and ovaries bilaterally.  Baby condition / location:  Couplet care / Skin to Skin   Complications: no complications  Indications: Rachel Conley is a 28 y.o. G1P1001 with an IUP [redacted]w[redacted]d presenting for iol for chtn, failed to progress past 5 cm, see progress note for details.  The risks, benefits, complications, treatment options, and exected outcomes were discussed with the patient . The patient dwith the proposed plan, giving informed consent. identified as [name] and the procedure verified as C-Section Delivery.  Procedure Details:  The patient was taken back to the operative suite where epidural anesthesia was dosed.  A time out was held and the above information confirmed.   After induction of anesthesia, the patient was draped and prepped in the usual sterile manner and placed in a dorsal supine position with a  leftward tilt. A Pfannenstiel incision was made and carried down through the subcutaneous tissue to the fascia. Fascial incision was made and bluntly extended transversely. The fascia was separated from the underlying rectus tissue superiorly and inferiorly. The peritoneum was identified and bluntly entered and extended longitudinally. Alexis retractor was placed. A bladder flap was not created. A low transverse uterine incision was made and extended bluntly. Delivered from cephalic presentation was a viable infant with Apgars and weight as above.  After waiting 60 seconds for delayed cord cutting, the umbilical cord was clamped and cut cord blood was obtained for evaluation. Cord ph was not sent. The placenta was removed Intact and appeared normal. The uterine outline, tubes and ovaries appeared normal. The uterine incision was closed with running unlocked sutures 0-Vicryl in one layer with placement of one 0 vicryl figure of 8 suture for hemostasis.   Hemostasis was observed. The peritoneum was closed with 2-0 vicryl. The rectus muscles were examined and hemostasis observed. The fascia was then reapproximated with running sutures of 0-Vicryl. The subcuticular closure was performed with 2-0 plain gut. The skin was closed with 4-0 Vicryl.  Instrument, sponge, and needle counts were correct prior the abdominal closure and were correct at the conclusion of the case.     Disposition: PACU - hemodynamically stable.   Maternal Condition: stable       Signed: Raymonde Calico, MD 03/26/2024 2:59 PM

## 2024-03-26 NOTE — Transfer of Care (Signed)
 Immediate Anesthesia Transfer of Care Note  Patient: Rachel Conley  Procedure(s) Performed: CESAREAN DELIVERY  Patient Location: PACU  Anesthesia Type:Epidural  Level of Consciousness: awake, alert , and oriented  Airway & Oxygen Therapy: Patient Spontanous Breathing  Post-op Assessment: Report given to RN and Post -op Vital signs reviewed and stable  Post vital signs: Reviewed and stable  Last Vitals:  Vitals Value Taken Time  BP 111/54 03/26/24 1530  Temp    Pulse 89 03/26/24 1535  Resp 30 03/26/24 1535  SpO2 99 % 03/26/24 1535  Vitals shown include unfiled device data.  Last Pain:  Vitals:   03/26/24 1331  TempSrc:   PainSc: 0-No pain     The patient states that she no longer has nausea.    Complications: No notable events documented.

## 2024-03-26 NOTE — Progress Notes (Signed)
 Labor Progress Note Rachel Conley is a 28 y.o. G1P0000 at [redacted]w[redacted]d presented for IOL d/t CHTN and LGA S: Patient is resting comfortably.  O:  BP (!) 106/57 (BP Location: Left Arm)   Pulse 76   Temp 98.2 F (36.8 C) (Oral)   Resp 16   Ht 5\' 3"  (1.6 m)   Wt 116.1 kg   LMP 06/25/2023   SpO2 98%   BMI 45.33 kg/m  EFM: 145/moderate/reactive accelerations  CVE: Dilation: 9 Effacement (%): 100 Cervical Position: Middle Station: 0 Presentation: Vertex Exam by:: Blue, RN   A&P: 28 y.o. G1P0000 [redacted]w[redacted]d for IOL d/t CHTN and LGA #Labor: Progressing well. Pitocin  reduced d/t tachysystole on last check, now appropriate #Pain: epidural #FWB: Cat I #GBS positive  [name], DO [facility] [employer], [facility] 6:10 AM

## 2024-03-26 NOTE — Progress Notes (Signed)
 Patient ID: Rachel Conley, female   DOB: Aug 06, 1996, 28 y.o.   MRN: [id number] Vitals:   03/26/24 0102 03/26/24 0129 03/26/24 0130 03/26/24 0200  BP: 119/62  (!) 110/51 125/67  Pulse: 85  84 84  Resp: 18 18  19   Temp:    98 F (36.7 C)  TempSrc:    Oral  SpO2: 97%  98% 98%  Weight:      Height:       FHR stable. Had variable decel earlier, which resolved with position change  UCs now tachysystole.   Last Exam:  Dilation: 5 Effacement (%): 70 Cervical Position: Middle Station: -1 Presentation: Vertex Exam by:: Blue, RN  Will cut Pitocin  in half then resume LR bolus x 500ml

## 2024-03-26 NOTE — Progress Notes (Signed)
 Patient is a 28 yo g1 @ 39+2 here for iol for chgn, also borderline lga and anemic with hgb in the 9s. She has been 5 cm for about 21 hours, has been ruptured for greater than 24 hours, and has been on pitocin  for greater than 24 hours. We discussed proceeding with cesarean for failed induction earlier this morning but patient wanted to give additional time and pitocin  a try, but unfortunately several hours later and at 38 of pitocin  cervix remains unchanged at 5 cm. Tracing is cat 1. I shared with patient that odds of vaginal delivery at this point are low, and that additional time in labor carries some risk of morbidity for mom and baby. She is aware she has the option to continue expectant management. Shared decision has been made to proceed with cesarean section for fetal indications. She declines tubal sterilization.  The risks of cesarean section were discussed with the patient including but were not limited to: bleeding which may require transfusion or reoperation; infection which may require antibiotics; injury to bowel, bladder, ureters or other surrounding organs; injury to the fetus; need for additional procedures including hysterectomy in the event of a life-threatening hemorrhage; placental abnormalities wth subsequent pregnancies, incisional problems, thromboembolic phenomenon and other postoperative/anesthesia complications.  Anesthesia and OR aware.  Preoperative prophylactic antibiotics and SCDs ordered on call to the OR.  To OR when ready.

## 2024-03-26 NOTE — Discharge Summary (Signed)
 Postpartum Discharge Summary  Patient Name: Rachel Conley DOB: Apr 30, 1996 MRN: [id number]  Date of admission: 03/24/2024 Delivery date:03/26/2024 Delivering provider: Hines Ludwig [place] Date of discharge: 03/28/2024  Admitting diagnosis: Encounter for induction of labor [Z34.90] Intrauterine pregnancy: [redacted]w[redacted]d     Secondary diagnosis:  Principal Problem:   S/P primary low transverse C-section Active Problems:   Supervision of other normal pregnancy, antepartum   Obesity in pregnancy, antepartum   Chronic hypertension affecting pregnancy   Anemia   LGA (large for gestational age) fetus affecting mother, antepartum   Polyhydramnios affecting pregnancy in third trimester   Encounter for induction of labor  Additional problems: n/a    Discharge diagnosis: Term Pregnancy Delivered and CHTN                                              Post partum procedures: n/a Augmentation: AROM, Pitocin , Cytotec , and IP Foley Complications: None  Hospital course: Induction of Labor With Cesarean Section   28 y.o. yo G1P1001 at [redacted]w[redacted]d was admitted to the hospital 03/24/2024 for induction of labor. Patient had a labor course significant for failure to progress with cervix at 5 cm for ~24 hours. The patient went for cesarean section due to  failed IOL . Delivery details are as follows: Membrane Rupture Time/Date: 7:35 AM,03/25/2024  Delivery Method:C-Section, Low Transverse Operative Delivery:N/A Details of operation can be found in separate operative Note.  Patient had a postpartum course that was uncomplicated. She is ambulating, tolerating a regular diet, passing flatus, and urinating well.  Patient is discharged home in stable condition on 03/28/24.      Newborn Data: Birth date:03/26/2024 Birth time:2:29 PM Gender:Female Living status:Living Apgars:9 ,9  Weight:3510 g                               Magnesium Sulfate received: No BMZ received: No Rhophylac:No MMR:No T-DaP:Given  prenatally Flu: Yes RSV Vaccine received: No Transfusion:No  Immunizations received: Immunization History  Administered Date(s) Administered   Influenza, Seasonal, Injecte, Preservative Fre 01/05/2024   Tdap 01/05/2024    Physical exam  Vitals:   03/27/24 0758 03/27/24 1432 03/27/24 2143 03/28/24 0530  BP: 122/72 123/65 (!) 116/59 134/82  Pulse: 65 68 73 72  Resp: 18 18 18 18   Temp: 98.5 F (36.9 C) 98.8 F (37.1 C) 98.2 F (36.8 C)   TempSrc: Oral Oral Oral   SpO2: 96% 98% 99% 98%  Weight:      Height:       General: alert, cooperative, and no distress Lochia: appropriate Uterine Fundus: firm Incision: Healing well with no significant drainage, No significant erythema, Dressing is clean, dry, and intact DVT Evaluation: No evidence of DVT seen on physical exam. Labs: Lab Results  Component Value Date   WBC 17.5 (H) 03/27/2024   HGB 9.5 (L) 03/27/2024   HCT 29.2 (L) 03/27/2024   MCV 88.2 03/27/2024   PLT 235 03/27/2024      Latest Ref Rng & Units 12/07/2023    4:20 PM  CMP  Glucose 70 - 99 mg/dL 84   BUN 6 - 20 mg/dL 8   Creatinine 6.21 - 3.08 mg/dL 6.57   Sodium 846 - 962 mmol/L 136   Potassium 3.5 - 5.2 mmol/L 4.1   Chloride 96 - 106 mmol/L  103   CO2 20 - 29 mmol/L 20   Calcium 8.7 - 10.2 mg/dL 8.9   Total Protein 6.0 - 8.5 g/dL 6.1   Total Bilirubin 0.0 - 1.2 mg/dL <1.6   Alkaline Phos 44 - 121 IU/L 98   AST 0 - 40 IU/L 18   ALT 0 - 32 IU/L 16    Edinburgh Score:    03/26/2024    5:30 PM  Edinburgh Postnatal Depression Scale Screening Tool  I have been able to laugh and see the funny side of things. 0  I have looked forward with enjoyment to things. 0  I have blamed myself unnecessarily when things went wrong. 1  I have been anxious or worried for no good reason. 0  I have felt scared or panicky for no good reason. 0  Things have been getting on top of me. 1  I have been so unhappy that I have had difficulty sleeping. 0  I have felt sad or  miserable. 1  I have been so unhappy that I have been crying. 1  The thought of harming myself has occurred to me. 0  Edinburgh Postnatal Depression Scale Total 4   No data recorded  After visit meds:  Allergies as of 03/28/2024   No Known Allergies      Medication List     STOP taking these medications    aspirin  EC 81 MG tablet       TAKE these medications    acetaminophen  500 MG tablet Commonly known as: TYLENOL  Take 2 tablets (1,000 mg total) by mouth every 6 (six) hours.   Blood Pressure Kit Devi 1 Device by Does not apply route once a week.   ferrous sulfate  325 (65 FE) MG tablet Take 1 tablet (325 mg total) by mouth every other day.   furosemide  40 MG tablet Commonly known as: LASIX  Take 1 tablet (40 mg total) by mouth daily.   ibuprofen  600 MG tablet Commonly known as: ADVIL  Take 1 tablet (600 mg total) by mouth every 6 (six) hours.   loratadine  10 MG tablet Commonly known as: Claritin  Take 1 tablet (10 mg total) by mouth daily.   oxyCODONE  5 MG immediate release tablet Commonly known as: Oxy IR/ROXICODONE  Take 1 tablet (5 mg total) by mouth every 4 (four) hours as needed for moderate pain (pain score 4-6).   pantoprazole  40 MG tablet Commonly known as: Protonix  Take 1 tablet (40 mg total) by mouth daily.   potassium chloride  SA 20 MEQ tablet Commonly known as: KLOR-CON  M Take 1 tablet (20 mEq total) by mouth daily.   PRENATAL VITAMINS PO Take 1 tablet by mouth daily.   senna-docusate 8.6-50 MG tablet Commonly known as: Senokot-S Take 2 tablets by mouth daily.         Discharge home in stable condition Infant Feeding: Breast Infant Disposition:home with mother Discharge instruction: per After Visit Summary and Postpartum booklet. Activity: Advance as tolerated. Pelvic rest for 6 weeks.  Diet: routine diet Future Appointments:No future appointments. Follow up Visit:  Message sent to [facility] 4/22  Please schedule this patient for a  In person postpartum visit in 6 weeks with the following provider: Any provider. Additional Postpartum F/U: Incision check 1 week and BP check 1 week  Low risk pregnancy complicated by:  cHTN, obesity Delivery mode:  C-Section, Low Transverse Anticipated Birth Control:  Nexplanon    03/28/2024 Ebony Goldstein, MD

## 2024-03-27 ENCOUNTER — Inpatient Hospital Stay (HOSPITAL_COMMUNITY): Admitting: Anesthesiology

## 2024-03-27 LAB — CBC
HCT: 29.2 % — ABNORMAL LOW (ref 36.0–46.0)
Hemoglobin: 9.5 g/dL — ABNORMAL LOW (ref 12.0–15.0)
MCH: 28.7 pg (ref 26.0–34.0)
MCHC: 32.5 g/dL (ref 30.0–36.0)
MCV: 88.2 fL (ref 80.0–100.0)
Platelets: 235 10*3/uL (ref 150–400)
RBC: 3.31 MIL/uL — ABNORMAL LOW (ref 3.87–5.11)
RDW: 14.6 % (ref 11.5–15.5)
WBC: 17.5 10*3/uL — ABNORMAL HIGH (ref 4.0–10.5)
nRBC: 0 % (ref 0.0–0.2)

## 2024-03-27 MED ORDER — FUROSEMIDE 20 MG PO TABS
40.0000 mg | ORAL_TABLET | Freq: Every day | ORAL | Status: DC
  Administered 2024-03-27 – 2024-03-28 (×2): 40 mg via ORAL
  Filled 2024-03-27 (×3): qty 2

## 2024-03-27 MED ORDER — POTASSIUM CHLORIDE CRYS ER 20 MEQ PO TBCR
20.0000 meq | EXTENDED_RELEASE_TABLET | Freq: Every day | ORAL | Status: DC
  Administered 2024-03-27 – 2024-03-28 (×2): 20 meq via ORAL
  Filled 2024-03-27 (×2): qty 1

## 2024-03-27 NOTE — Lactation Note (Signed)
 This note was copied from a baby's chart. Lactation Consultation Note  Patient Name: Rachel Conley [id number] Date: 03/27/2024 Age:27 hours Reason for consult: Follow-up assessment;Mother's request;RN request  P1, Mother request for assistance.  With mother's permission reviewed hand expression with good flow. Assisted with latching in football hold. Baby was sleepy. FOB changed voids/stool diaper and then assisted with feeding in cross cradle hold with mother supporting breast. Baby was able to sustain latch with intermittent compression to keep him active. Feed on demand with cues.  Goal 8-12+ times per day after first 24 hrs.  Place baby STS if not cueing.  Suggest calling for help as needed.   Maternal Data Has patient been taught Hand Expression?: Yes Does the patient have breastfeeding experience prior to this delivery?: No  Feeding Mother's Current Feeding Choice: Breast Milk  LATCH Score Latch: Grasps breast easily, tongue down, lips flanged, rhythmical sucking.  Audible Swallowing: A few with stimulation  Type of Nipple: Everted at rest and after stimulation  Comfort (Breast/Nipple): Soft / non-tender  Hold (Positioning): Assistance needed to correctly position infant at breast and maintain latch.  LATCH Score: 8   Interventions Interventions: Breast feeding basics reviewed;Assisted with latch;Skin to skin;Hand express;Education  Discharge Pump: Personal;Hands Free (Zoomie)  Consult Status Consult Status: Follow-up Date: 03/28/24 Follow-up type: In-patient   Luellen Sages  RN  IBCLC 03/27/2024, 1:27 PM

## 2024-03-27 NOTE — Anesthesia Postprocedure Evaluation (Signed)
 Anesthesia Post Note  Patient: Rachel Conley  Procedure(s) Performed: CESAREAN DELIVERY     Patient location during evaluation: PACU Anesthesia Type: Epidural Level of consciousness: awake and alert Pain management: pain level controlled Vital Signs Assessment: post-procedure vital signs reviewed and stable Respiratory status: spontaneous breathing, respiratory function stable and nonlabored ventilation Cardiovascular status: blood pressure returned to baseline Postop Assessment: epidural receding and no apparent nausea or vomiting Anesthetic complications: no   No notable events documented.  Last Vitals:  Vitals:   03/26/24 2338 03/27/24 0330  BP:  130/76  Pulse:    Resp: 18 17  Temp: 36.7 C 36.8 C  SpO2: 99% 99%    Last Pain:  Vitals:   03/27/24 0601  TempSrc:   PainSc: 1                  Juventino Oppenheim

## 2024-03-27 NOTE — Progress Notes (Signed)
 POSTPARTUM PROGRESS NOTE  Subjective: Rachel Conley is a 28 y.o. G1P1001 s/p pLTCS at [redacted]w[redacted]d.  She reports she is doing well. No acute events overnight. She denies any problems with ambulating or po intake. Has not urinated after removal of catheter yet. Denies nausea or vomiting. She has not passed flatus. Pain is well controlled.  Lochia is moderate.  Objective: Blood pressure 122/72, pulse 65, temperature 98.5 F (36.9 C), temperature source Oral, resp. rate 18, height 5\' 3"  (1.6 m), weight 116.1 kg, last menstrual period 06/25/2023, SpO2 96%, unknown if currently breastfeeding.  Physical Exam:  General: alert, cooperative and no distress Chest: no respiratory distress Abdomen: soft, non-tender  Uterine Fundus: firm and at level of umbilicus Extremities: No calf swelling or tenderness  Trace edema  Recent Labs    03/25/24 0550 03/27/24 0447  HGB 9.6* 9.5*  HCT 29.4* 29.2*    Assessment/Plan: Rachel Conley is a 28 y.o. G1P1001 s/p pLTCS at [redacted]w[redacted]d for failed induction.  Routine Postpartum Care: Doing well, pain well-controlled.  -- Continue routine care, lactation support  -- Contraception: Nexplanon  -- Feeding: breast  -- cHTN: Normotensive PP. Start lasix /K today for 5 days  Dispo: Plan for discharge 4/24 or 4/25 per patient preference.  Maud Sorenson, MD OB Fellow 03/27/2024 9:55 AM

## 2024-03-27 NOTE — Anesthesia Postprocedure Evaluation (Signed)
 Anesthesia Post Note  Patient: [name]  Procedure(s) Performed: AN AD HOC EPIDURAL     Patient location during evaluation: Mother Baby Anesthesia Type: Epidural Level of consciousness: awake and alert Pain management: pain level controlled Vital Signs Assessment: post-procedure vital signs reviewed and stable Respiratory status: spontaneous breathing, nonlabored ventilation and respiratory function stable Cardiovascular status: stable Postop Assessment: no headache, no backache and epidural receding Anesthetic complications: no   No notable events documented.  Last Vitals:  Vitals:   03/27/24 0330 03/27/24 0758  BP: 130/76 122/72  Pulse:  65  Resp: 17 18  Temp: 36.8 C 36.9 C  SpO2: 99% 96%    Last Pain:  Vitals:   03/27/24 0758  TempSrc: Oral  PainSc: 1    Pain Goal:                   Sabrina Crandall

## 2024-03-28 ENCOUNTER — Other Ambulatory Visit (HOSPITAL_COMMUNITY): Payer: Self-pay

## 2024-03-28 DIAGNOSIS — Z30017 Encounter for initial prescription of implantable subdermal contraceptive: Secondary | ICD-10-CM | POA: Diagnosis not present

## 2024-03-28 LAB — BIRTH TISSUE RECOVERY COLLECTION (PLACENTA DONATION)

## 2024-03-28 MED ORDER — LIDOCAINE HCL 1 % IJ SOLN
0.0000 mL | Freq: Once | INTRAMUSCULAR | Status: DC | PRN
  Filled 2024-03-28: qty 20

## 2024-03-28 MED ORDER — SENNOSIDES-DOCUSATE SODIUM 8.6-50 MG PO TABS
2.0000 | ORAL_TABLET | ORAL | 0 refills | Status: AC
  Filled 2024-03-28: qty 30, 15d supply, fill #0

## 2024-03-28 MED ORDER — WHITE PETROLATUM EX OINT: 1.0000 | TOPICAL_OINTMENT | CUTANEOUS | Status: DC | PRN

## 2024-03-28 MED ORDER — FERROUS SULFATE 325 (65 FE) MG PO TABS
325.0000 mg | ORAL_TABLET | ORAL | Status: DC
  Administered 2024-03-28: 325 mg via ORAL
  Filled 2024-03-28: qty 1

## 2024-03-28 MED ORDER — POTASSIUM CHLORIDE CRYS ER 20 MEQ PO TBCR
20.0000 meq | EXTENDED_RELEASE_TABLET | Freq: Every day | ORAL | 0 refills | Status: AC
  Filled 2024-03-28: qty 5, 5d supply, fill #0

## 2024-03-28 MED ORDER — LIDOCAINE HCL 1 % IJ SOLN
0.0000 mL | Freq: Once | INTRAMUSCULAR | Status: AC | PRN
  Administered 2024-03-28: 20 mL via INTRADERMAL

## 2024-03-28 MED ORDER — ETONOGESTREL 68 MG ~~LOC~~ IMPL
68.0000 mg | DRUG_IMPLANT | Freq: Once | SUBCUTANEOUS | Status: AC
  Administered 2024-03-28: 68 mg via SUBCUTANEOUS

## 2024-03-28 MED ORDER — IBUPROFEN 600 MG PO TABS
600.0000 mg | ORAL_TABLET | Freq: Four times a day (QID) | ORAL | 0 refills | Status: AC
  Filled 2024-03-28: qty 30, 8d supply, fill #0

## 2024-03-28 MED ORDER — SUCROSE 24% NICU/PEDS ORAL SOLUTION: 0.5000 mL | OROMUCOSAL | Status: DC | PRN

## 2024-03-28 MED ORDER — ETONOGESTREL 68 MG ~~LOC~~ IMPL
68.0000 mg | DRUG_IMPLANT | Freq: Once | SUBCUTANEOUS | Status: DC
  Filled 2024-03-28: qty 1

## 2024-03-28 MED ORDER — FUROSEMIDE 40 MG PO TABS
40.0000 mg | ORAL_TABLET | Freq: Every day | ORAL | 0 refills | Status: AC
  Filled 2024-03-28: qty 5, 5d supply, fill #0

## 2024-03-28 MED ORDER — OXYCODONE HCL 5 MG PO TABS
5.0000 mg | ORAL_TABLET | ORAL | 0 refills | Status: AC | PRN
  Filled 2024-03-28: qty 15, 3d supply, fill #0

## 2024-03-28 MED ORDER — ACETAMINOPHEN 500 MG PO TABS
1000.0000 mg | ORAL_TABLET | Freq: Four times a day (QID) | ORAL | 0 refills | Status: AC
  Filled 2024-03-28: qty 30, 4d supply, fill #0

## 2024-03-28 MED ORDER — GELATIN ABSORBABLE 12-7 MM EX MISC: 1.0000 | Freq: Once | CUTANEOUS | Status: DC | PRN

## 2024-03-28 MED ORDER — LIDOCAINE 1% INJECTION FOR CIRCUMCISION: 0.8000 mL | INJECTION | Freq: Once | INTRAVENOUS | Status: DC

## 2024-03-28 MED ORDER — EPINEPHRINE TOPICAL FOR CIRCUMCISION 0.1 MG/ML: 1.0000 [drp] | TOPICAL | Status: DC | PRN

## 2024-03-28 NOTE — Progress Notes (Signed)
 GYNECOLOGY PROCEDURE NOTE  Rachel Conley is a 28 y.o. G1P1001 requesting Nexplanon  insertion. No gynecologic concerns.  Nexplanon  Insertion Procedure Patient identified, informed consent performed, consent signed. Patient does understand that irregular bleeding is a very common side effect of this medication. She was advised to have backup contraception for one week after placement. Appropriate time out taken. Patient's right  arm was prepped and draped in the usual sterile fashion. The insertion area was measured and marked. Patient was prepped with alcohol swab and then injected with 3 ml of 1% lidocaine . The area was then prepped with betadine. Nexplanon  removed from packaging and device confirmed present within needle, then inserted full length of needle and withdrawn per handbook instructions. Nexplanon  was able to palpated in the patient's arm; patient palpated the insert herself. There was minimal blood loss. Patient insertion site covered with steri strip, guaze, and a pressure bandage to reduce any bruising. The patient tolerated the procedure well and was given post procedure instructions.   Exp: 2027-06 Lot: [id number]   Virl Grimes) Marlys Singh, MSN, CNM  Center for [facility]  03/28/2024 11:32 AM

## 2024-03-28 NOTE — Discharge Instructions (Signed)

## 2024-03-28 NOTE — Progress Notes (Signed)
 POSTPARTUM PROGRESS NOTE  POD #2  Subjective:  Cailen Texeira is a 28 y.o. G1P1001 s/p pLTCS at [redacted]w[redacted]d. No acute events overnight. She reports she is doing well. She denies any problems with ambulating, voiding or po intake. Denies nausea or vomiting. She has passed flatus. Pain is well controlled.  Lochia is appropriate.  Objective: Blood pressure 134/82, pulse 72, temperature 98.2 F (36.8 C), temperature source Oral, resp. rate 18, height 5\' 3"  (1.6 m), weight 116.1 kg, last menstrual period 06/25/2023, SpO2 98%, unknown if currently breastfeeding.  Physical Exam:  General: alert, cooperative and no distress Chest: no respiratory distress Heart: regular rate, distal pulses intact Uterine Fundus: firm, appropriately tender DVT Evaluation: No calf swelling or tenderness Extremities: trace edema Skin: warm, dry; incision clean/dry/intact w/ honeycomb dressing in place  Recent Labs    03/27/24 0447  HGB 9.5*  HCT 29.2*    Assessment/Plan: Kyia Rhude is a 28 y.o. G1P1001 s/p pLTCS at [redacted]w[redacted]d for failed IOL.  POD#2 - Doing welll; pain well controlled. H/H appropriate  Routine postpartum care  OOB, ambulated  Lovenox  for VTE prophylaxis Acute Blood Loss Anemia: asymptomatic, not clinically significant given stable Hgb at 9.5  Start po ferrous sulfate  every other day gHTN: Lasix  40mg  every day, K PO every day,  Contraception: Nexplanon  Feeding: breast  Dispo: Plan for discharge today.   LOS: 4 days   Ebony Goldstein, MD OB Fellow  03/28/2024, 6:10 AM

## 2024-03-28 NOTE — Lactation Note (Signed)
 This note was copied from a baby's chart. Lactation Consultation Note  Patient Name: Rachel Conley [id number] Date: 03/28/2024 Age:27 hours Reason for consult: Follow-up assessment;Primapara;1st time breastfeeding;Term;Maternal discharge  P1 parents expressed feeling confident with breastfeeding and having more understanding of feeding, cues etc. Denied need of supplies or having any questions. Wished family well.   Maternal Data  P1  Feeding Mother's Current Feeding Choice: Breast Milk     Interventions Interventions: Breast feeding basics reviewed;Education  Discharge    Consult Status Consult Status: Complete Date: 03/28/24    Ardia Kraft 03/28/2024, 1:35 PM

## 2024-04-08 ENCOUNTER — Telehealth (HOSPITAL_COMMUNITY): Payer: Self-pay | Admitting: *Deleted

## 2024-04-08 NOTE — Telephone Encounter (Signed)
 04/08/2024  Name: Rachel Conley MRN: [id number] DOB: [birth year]  Reason for Call:  Transition of Care Hospital Discharge Call  Contact Status: Patient Contact Status: Complete  Language assistant needed:          Follow-Up Questions: Do You Have Any Concerns About Your Health As You Heal From Delivery?: No Do You Have Any Concerns About Your Infants Health?: No  Edinburgh Postnatal Depression Scale:  In the Past 7 Days: I have been able to laugh and see the funny side of things.: As much as I always could I have looked forward with enjoyment to things.: As much as I ever did I have blamed myself unnecessarily when things went wrong.: No, never I have been anxious or worried for no good reason.: No, not at all I have felt scared or panicky for no good reason.: No, not at all Things have been getting on top of me.: No, I have been coping as well as ever I have been so unhappy that I have had difficulty sleeping.: Not at all I have felt sad or miserable.: No, not at all I have been so unhappy that I have been crying.: No, never The thought of harming myself has occurred to me.: Never [name] Postnatal Depression Scale Total: 0  PHQ2-9 Depression Scale:     Discharge Follow-up: Edinburgh score requires follow up?: No Patient was advised of the following resources:: Breastfeeding Support Group, Support Group  Post-discharge interventions: Reviewed Newborn Safe Sleep Practices  Signature Julien Odor, RN, 04/08/24, [phone]

## 2024-04-12 ENCOUNTER — Encounter: Payer: Self-pay | Admitting: Obstetrics and Gynecology

## 2024-04-26 NOTE — Progress Notes (Signed)
See NST note.

## 2024-05-06 NOTE — Progress Notes (Signed)
 After review, MFM consult with provider is not indicated for today  Cassandria Clever, MD 05/06/2024 11:03 AM  [facility]

## 2024-05-30 ENCOUNTER — Encounter: Payer: Self-pay | Admitting: Physician Assistant

## 2024-06-13 ENCOUNTER — Encounter: Payer: Self-pay | Admitting: Obstetrics

## 2024-06-13 ENCOUNTER — Ambulatory Visit: Admitting: Obstetrics

## 2024-06-13 DIAGNOSIS — R030 Elevated blood-pressure reading, without diagnosis of hypertension: Secondary | ICD-10-CM | POA: Diagnosis not present

## 2024-06-13 DIAGNOSIS — Z3046 Encounter for surveillance of implantable subdermal contraceptive: Secondary | ICD-10-CM

## 2024-06-13 DIAGNOSIS — D509 Iron deficiency anemia, unspecified: Secondary | ICD-10-CM | POA: Diagnosis not present

## 2024-06-13 NOTE — Progress Notes (Signed)
 ..   Post Partum Visit Note  Rachel Conley is a 28 y.o. G28P1001 female who presents for a postpartum visit. She is a few weeks postpartum following a primary cesarean section.  I have fully reviewed the prenatal and intrapartum course. The delivery was at 39.2 gestational weeks.  Anesthesia: epidural. Postpartum course has been good. Baby is doing well. Baby is feeding by both breast and bottle - Similac Sensitive RS. Bleeding staining only. Bowel function is normal. Bladder function is normal. Patient is sexually active. Contraception method is Nexplanon . Postpartum depression screening: negative.   The pregnancy intention screening data noted above was reviewed. Potential methods of contraception were discussed. The patient elected to proceed with No data recorded.   Edinburgh Postnatal Depression Scale - 06/13/24 1008       Edinburgh Postnatal Depression Scale:  In the Past 7 Days   I have been able to laugh and see the funny side of things. 0    I have looked forward with enjoyment to things. 0    I have blamed myself unnecessarily when things went wrong. 0    I have been anxious or worried for no good reason. 0    I have felt scared or panicky for no good reason. 0    Things have been getting on top of me. 0    I have been so unhappy that I have had difficulty sleeping. 0    I have felt sad or miserable. 0    I have been so unhappy that I have been crying. 0    The thought of harming myself has occurred to me. 0    Edinburgh Postnatal Depression Scale Total 0          Health Maintenance Due  Topic Date Due   HPV VACCINES (1 - 3-dose series) Never done   Hepatitis B Vaccines (2 of 2 - CpG 2-dose series) 09/26/2017   COVID-19 Vaccine (3 - 2024-25 season) 08/06/2023    The following portions of the patient's history were reviewed and updated as appropriate: allergies, current medications, past family history, past medical history, past social history, past surgical history,  and problem list.  Review of Systems A comprehensive review of systems was negative.  Objective:  BP (!) 148/102   Pulse 72   LMP 06/25/2023   Breastfeeding Yes    General:  alert and no distress   Breasts:  normal  Lungs: clear to auscultation bilaterally  Heart:  regular rate and rhythm, S1, S2 normal, no murmur, click, rub or gallop  Abdomen: soft, non-tender; bowel sounds normal; no masses,  no organomegaly   Wound well approximated incision  GU exam:  not indicated       Assessment:   1. Postpartum care following cesarean delivery (Primary)  2. Iron deficiency anemia, unspecified iron deficiency anemia type  3. Encounter for surveillance of implantable subdermal contraceptive  4. Elevated BP without diagnosis of hypertension - negative for HA, visual changes or significant edema - patient sent to [facility] for further evaluation - she wants to continue her postpartum care in [place], where she currently resides; and would like her medical records transferred to [facility]:   Essential components of care per ACOG recommendations:  1.  Mood and well being: Patient with negative depression screening today. Reviewed local resources for support.  - Patient tobacco use? No.   - hx of drug use? No.    2. Infant care and feeding:  -  Patient currently breastmilk feeding? Yes. Discussed returning to work and pumping. Reviewed importance of draining breast regularly to support lactation.  -Social determinants of health (SDOH) reviewed in EPIC. No concerns  3. Sexuality, contraception and birth spacing - Patient does not want a pregnancy in the next year.  Desired family size is 2 children.  - Reviewed reproductive life planning. Reviewed contraceptive methods based on pt preferences and effectiveness.  Patient desired Hormonal Implant today.   - Discussed birth spacing of 18 months  4. Sleep and fatigue -Encouraged family/partner/community support of 4 hrs of  uninterrupted sleep to help with mood and fatigue  5. Physical Recovery  - Discussed patients delivery and complications. She describes her labor as bad. - Patient had a C-section, no problems at delivery.  - Patient has urinary incontinence? No. - Patient is safe to resume physical and sexual activity  6.  Health Maintenance - HM due items addressed Yes - Last pap smear  Diagnosis  Date Value Ref Range Status  09/14/2023   Final   - Negative for intraepithelial lesion or malignancy (NILM)   Pap smear not done at today's visit.  -Breast Cancer screening indicated? No.   7. Chronic Disease/Pregnancy Condition follow up: None    Carlin [facility], MD Center for [facility], [facility], [place] 06/13/2024

## 2024-07-11 ENCOUNTER — Ambulatory Visit: Admitting: Obstetrics
